# Patient Record
Sex: Male | Born: 1985 | Race: White | Hispanic: No | Marital: Single | Smoking: Current every day smoker
Health system: Southern US, Community
[De-identification: ages and names within clinical notes are randomized; demographics above are authoritative.]

## PROBLEM LIST (undated history)

## (undated) DIAGNOSIS — R569 Unspecified convulsions: Secondary | ICD-10-CM

---
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
## 2011-11-05 ENCOUNTER — Encounter (HOSPITAL_COMMUNITY): Payer: Self-pay | Admitting: Emergency Medicine

## 2011-11-05 ENCOUNTER — Emergency Department (HOSPITAL_COMMUNITY)
Admission: EM | Admit: 2011-11-05 | Discharge: 2011-11-05 | Discharge status: Against Medical Advice | Payer: Self-pay | Attending: Emergency Medicine | Admitting: Emergency Medicine

## 2011-11-05 DIAGNOSIS — R569 Unspecified convulsions: Secondary | ICD-10-CM | POA: Insufficient documentation

## 2011-11-05 DIAGNOSIS — R51 Headache: Secondary | ICD-10-CM | POA: Insufficient documentation

## 2011-11-05 DIAGNOSIS — F17200 Nicotine dependence, unspecified, uncomplicated: Secondary | ICD-10-CM | POA: Insufficient documentation

## 2011-11-05 NOTE — ED Notes (Signed)
Patient has refused all test.... Did not obtain urine because of this reason. Patient states he is ready to leave AMA
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
## 2011-11-05 NOTE — ED Notes (Signed)
Pt here a/o x 3 with c/o slight headache. Pt denies medical problems in the past.
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
## 2016-06-02 ENCOUNTER — Emergency Department (HOSPITAL_COMMUNITY)
Admission: EM | Admit: 2016-06-02 | Discharge: 2016-06-03 | Disposition: A | Discharge status: Home / Self Care | Payer: Self-pay | Attending: Emergency Medicine | Admitting: Emergency Medicine

## 2016-06-02 ENCOUNTER — Emergency Department (HOSPITAL_COMMUNITY): Payer: Self-pay

## 2016-06-02 ENCOUNTER — Encounter (HOSPITAL_COMMUNITY): Payer: Self-pay

## 2016-06-02 DIAGNOSIS — F17200 Nicotine dependence, unspecified, uncomplicated: Secondary | ICD-10-CM | POA: Insufficient documentation

## 2016-06-02 DIAGNOSIS — M79671 Pain in right foot: Secondary | ICD-10-CM | POA: Insufficient documentation

## 2016-06-02 MED ORDER — DICLOFENAC SODIUM 50 MG PO TBEC: 50.0000 mg | DELAYED_RELEASE_TABLET | Freq: Two times a day (BID) | ORAL | 0 refills | Status: DC

## 2016-06-02 NOTE — ED Provider Notes (Signed)
[facility]-EMERGENCY DEPT Provider Note   CSN: [id number] Arrival date & time: 06/02/16  2304  By signing my name below, I, Modena Jansky, attest that this documentation has been prepared under the direction and in the presence of non-physician practitioner, Kerrie Buffalo, NP. Electronically Signed: Modena Jansky, Scribe. 06/02/2016. 11:31 PM.  History   Chief Complaint Chief Complaint  Patient presents with  . Foot Pain   The history is provided by the patient. No language interpreter was used.  Foot Pain  This is a new problem. The current episode started more than 1 week ago. The problem occurs constantly. The problem has been gradually worsening. The symptoms are aggravated by walking. The symptoms are relieved by medications. He has tried acetaminophen for the symptoms. The treatment provided mild relief.   HPI Comments: Jeffrey Rios is a 30 y.o. male who presents to the Emergency Department complaining of right foot pain that started a month ago. He states the pain started on the bottom and gradually spread to the rest of the foot Swelling started 2 days ago. Pain is exacerbated by ambulation and relieved by OTC pain medication. He works at [employer], but recalls no specific injury. Denies any other complaints.   History reviewed. No pertinent past medical history.  There are no active problems to display for this patient.   History reviewed. No pertinent surgical history.     Home Medications    Prior to Admission medications   Medication Sig Start Date End Date Taking? Authorizing Provider  diclofenac (VOLTAREN) 50 MG EC tablet Take 1 tablet (50 mg total) by mouth 2 (two) times daily. 06/02/16   Haila Dena Orlene Och, NP    Family History History reviewed. No pertinent family history.  Social History Social History  Substance Use Topics  . Smoking status: Current Every Day Smoker  . Smokeless tobacco: Never Used  . Alcohol use Yes     Comment: daily     Allergies     Penicillins   Review of Systems Review of Systems  Constitutional: Negative for fever.  Musculoskeletal: Positive for arthralgias and joint swelling.       Right foot pain   Physical Exam Updated Vital Signs BP 134/80 (BP Location: Right Arm)   Pulse 80   Temp 98.2 F (36.8 C) (Oral)   Resp 16   SpO2 100%   Physical Exam  Constitutional: He appears well-developed and well-nourished. No distress.  HENT:  Head: Normocephalic and atraumatic.  Eyes: Conjunctivae are normal.  Neck: Neck supple.  Cardiovascular: Normal rate.   Pulmonary/Chest: Effort normal.  Abdominal: Soft.  Musculoskeletal:       Right foot: There is tenderness. There is normal range of motion, normal capillary refill, no crepitus, no deformity and no laceration. Swelling: minimal.  Pedal pulses are +2. DP pulses are +2. Pain radiates to the lateral aspect of the right ankle. No calf tenderness.   Neurological: He is alert.  Skin: Skin is warm and dry.  Psychiatric: He has a normal mood and affect. His behavior is normal.  Nursing note and vitals reviewed.    ED Treatments / Results  DIAGNOSTIC STUDIES: Oxygen Saturation is 100% on RA, normal by my interpretation.    COORDINATION OF CARE: 11:35 PM- Pt advised of plan for treatment and pt agrees.  Labs (all labs ordered are listed, but only abnormal results are displayed) Labs Reviewed - No data to display  Radiology Dg Foot Complete Right  Result Date: 06/02/2016 CLINICAL DATA:  30 y/o M; pain in the ball of foot none goes around the front of the foot in across the metatarsals starting 1 month ago. No known injury. EXAM: RIGHT FOOT COMPLETE - 3+ VIEW COMPARISON:  None. FINDINGS: There is no evidence of fracture or dislocation. There is no evidence of arthropathy or other focal bone abnormality. Soft tissues are unremarkable. IMPRESSION: Negative. Electronically Signed   By: Mitzi HansenLance  Furusawa-Stratton M.D.   On: 06/02/2016 23:43     Procedures Procedures (including critical care time)  Medications Ordered in ED Medications - No data to display   Initial Impression / Assessment and Plan / ED Course  I have reviewed the triage vital signs and the nursing notes.  Pertinent imaging results that were available during my care of the patient were reviewed by me and considered in my medical decision making (see chart for details).  Clinical Course    Patient X-Ray negative for obvious fracture or dislocation. Pain managed in ED. Pt advised to follow up with orthopedics if symptoms persist for possibility of missed fracture diagnosis. Patient given brace while in ED, conservative therapy recommended and discussed. Patient will be dc home & is agreeable with above plan.  Final Clinical Impressions(s) / ED Diagnoses   Final diagnoses:  Foot pain, right    New Prescriptions New Prescriptions   DICLOFENAC (VOLTAREN) 50 MG EC TABLET    Take 1 tablet (50 mg total) by mouth 2 (two) times daily.   I personally performed the services described in this documentation, which was scribed in my presence. The recorded information has been reviewed and is accurate.     [address]Marsena Taff MarieM Palyn Scrima, NP 06/03/16 0004    Dione Boozeavid Glick, MD 06/03/16 [phone]

## 2016-06-02 NOTE — Discharge Instructions (Signed)
If the pain continues call Dr. Diamantina Providenceean's office for follow up. Try and elevate the area, apply ice and wear the ankle brace for comfort.

## 2016-06-02 NOTE — ED Triage Notes (Signed)
Pt complaining of foot pain. Pt states swelling and pain to right foot. Pt denies any injury/trauma.

## 2020-02-18 ENCOUNTER — Emergency Department (HOSPITAL_COMMUNITY)
Admission: EM | Admit: 2020-02-18 | Discharge: 2020-02-18 | Disposition: A | Discharge status: Home / Self Care | Payer: Self-pay | Attending: Emergency Medicine | Admitting: Emergency Medicine

## 2020-02-18 ENCOUNTER — Emergency Department (HOSPITAL_COMMUNITY): Payer: Self-pay

## 2020-02-18 ENCOUNTER — Other Ambulatory Visit: Payer: Self-pay

## 2020-02-18 DIAGNOSIS — Z79899 Other long term (current) drug therapy: Secondary | ICD-10-CM | POA: Insufficient documentation

## 2020-02-18 DIAGNOSIS — S0990XA Unspecified injury of head, initial encounter: Secondary | ICD-10-CM | POA: Insufficient documentation

## 2020-02-18 DIAGNOSIS — Y999 Unspecified external cause status: Secondary | ICD-10-CM | POA: Insufficient documentation

## 2020-02-18 DIAGNOSIS — Y939 Activity, unspecified: Secondary | ICD-10-CM | POA: Insufficient documentation

## 2020-02-18 DIAGNOSIS — R4182 Altered mental status, unspecified: Secondary | ICD-10-CM | POA: Insufficient documentation

## 2020-02-18 DIAGNOSIS — X58XXXA Exposure to other specified factors, initial encounter: Secondary | ICD-10-CM | POA: Insufficient documentation

## 2020-02-18 DIAGNOSIS — F17210 Nicotine dependence, cigarettes, uncomplicated: Secondary | ICD-10-CM | POA: Insufficient documentation

## 2020-02-18 DIAGNOSIS — Y9259 Other trade areas as the place of occurrence of the external cause: Secondary | ICD-10-CM | POA: Insufficient documentation

## 2020-02-18 LAB — CBC WITH DIFFERENTIAL/PLATELET
Abs Immature Granulocytes: 0.05 10*3/uL (ref 0.00–0.07)
Basophils Absolute: 0.1 10*3/uL (ref 0.0–0.1)
Basophils Relative: 0 %
Eosinophils Absolute: 0.1 10*3/uL (ref 0.0–0.5)
Eosinophils Relative: 0 %
HCT: 41.2 % (ref 39.0–52.0)
Hemoglobin: 13.8 g/dL (ref 13.0–17.0)
Immature Granulocytes: 0 %
Lymphocytes Relative: 16 %
Lymphs Abs: 2.2 10*3/uL (ref 0.7–4.0)
MCH: 30.9 pg (ref 26.0–34.0)
MCHC: 33.5 g/dL (ref 30.0–36.0)
MCV: 92.4 fL (ref 80.0–100.0)
Monocytes Absolute: 0.7 10*3/uL (ref 0.1–1.0)
Monocytes Relative: 5 %
Neutro Abs: 11 10*3/uL — ABNORMAL HIGH (ref 1.7–7.7)
Neutrophils Relative %: 79 %
Platelets: 337 10*3/uL (ref 150–400)
RBC: 4.46 MIL/uL (ref 4.22–5.81)
RDW: 12.7 % (ref 11.5–15.5)
WBC: 14 10*3/uL — ABNORMAL HIGH (ref 4.0–10.5)
nRBC: 0 % (ref 0.0–0.2)

## 2020-02-18 LAB — COMPREHENSIVE METABOLIC PANEL
ALT: 25 U/L (ref 0–44)
AST: 48 U/L — ABNORMAL HIGH (ref 15–41)
Albumin: 5 g/dL (ref 3.5–5.0)
Alkaline Phosphatase: 88 U/L (ref 38–126)
Anion gap: 11 (ref 5–15)
BUN: 20 mg/dL (ref 6–20)
CO2: 26 mmol/L (ref 22–32)
Calcium: 9.5 mg/dL (ref 8.9–10.3)
Chloride: 106 mmol/L (ref 98–111)
Creatinine, Ser: 1.41 mg/dL — ABNORMAL HIGH (ref 0.61–1.24)
GFR calc Af Amer: >60 mL/min (ref >60)
GFR calc non Af Amer: >60 mL/min (ref >60)
Glucose, Bld: 92 mg/dL (ref 70–99)
Potassium: 3.8 mmol/L (ref 3.5–5.1)
Sodium: 143 mmol/L (ref 135–145)
Total Bilirubin: 1 mg/dL (ref 0.3–1.2)
Total Protein: 8.1 g/dL (ref 6.5–8.1)

## 2020-02-18 LAB — RAPID URINE DRUG SCREEN, HOSP PERFORMED
Amphetamines: POSITIVE — AB
Barbiturates: NOT DETECTED
Benzodiazepines: POSITIVE — AB
Cocaine: POSITIVE — AB
Opiates: NOT DETECTED
Tetrahydrocannabinol: POSITIVE — AB

## 2020-02-18 LAB — ETHANOL: Alcohol, Ethyl (B): <10 mg/dL (ref <10)

## 2020-02-18 LAB — ACETAMINOPHEN LEVEL: Acetaminophen (Tylenol), Serum: <10 ug/mL — ABNORMAL LOW (ref 10–30)

## 2020-02-18 LAB — SALICYLATE LEVEL: Salicylate Lvl: <7 mg/dL — ABNORMAL LOW (ref 7.0–30.0)

## 2020-02-18 MED ORDER — SODIUM CHLORIDE 0.9 % IV BOLUS
500.0000 mL | Freq: Once | INTRAVENOUS | Status: AC
  Administered 2020-02-18: 500 mL via INTRAVENOUS

## 2020-02-18 NOTE — ED Notes (Signed)
Pt in CT.

## 2020-02-18 NOTE — ED Notes (Signed)
Pt. Was given a cup of ice water and a sandwich.
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
## 2020-02-18 NOTE — Progress Notes (Signed)
CSW created list of resources for Pt  [facility] [address] [place], [place], [place] [address] [phone]  Call to make an appointment. No insurance needed. Ask about [employer] eligibility!   [facility]  [address], [place], [place] [address] [phone]  Low cost/no cost therapeutic services available.  [facility] Services Hotline [phone]
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
## 2020-02-18 NOTE — Discharge Instructions (Addendum)
At this time there does not appear to be the presence of an emergent medical condition, however there is always the potential for conditions to change. Please read and follow the below instructions.  Please return to the Emergency Department immediately for any new or worsening symptoms. Please be sure to follow up with your Primary Care Provider within one week regarding your visit today; please call their office to schedule an appointment even if you are feeling better for a follow-up visit.  Get help right away if: You have: A very bad headache that is not helped by medicine. Trouble walking or weakness in your arms and legs. Clear or bloody fluid coming from your nose or ears. Changes in how you see (vision). Shaking movements that you cannot control. You lose your balance. You vomit. The black centers of your eyes (pupils) change in size. Your speech is slurred. Your dizziness gets worse. You pass out. You are sleepier than normal and have trouble staying awake. Your symptoms get worse. These symptoms may be an emergency. Do not wait to see if the symptoms will go away. Get medical help right away. Call your local emergency services (911 in the [place] Do not drive yourself to the hospital.  Please read the additional information packets attached to your discharge summary.  Do not take your medicine if  develop an itchy rash, swelling in your mouth or lips, or difficulty breathing; call 911 and seek immediate emergency medical attention if this occurs.  Note: Portions of this text may have been transcribed using voice recognition software. Every effort was made to ensure accuracy; however, inadvertent computerized transcription errors may still be present.
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
## 2020-02-22 ENCOUNTER — Emergency Department (HOSPITAL_COMMUNITY)
Admission: EM | Admit: 2020-02-22 | Discharge: 2020-02-22 | Discharge status: Against Medical Advice | Payer: Self-pay | Attending: Emergency Medicine | Admitting: Emergency Medicine

## 2020-02-22 DIAGNOSIS — F17200 Nicotine dependence, unspecified, uncomplicated: Secondary | ICD-10-CM | POA: Insufficient documentation

## 2020-02-22 DIAGNOSIS — T50901A Poisoning by unspecified drugs, medicaments and biological substances, accidental (unintentional), initial encounter: Secondary | ICD-10-CM | POA: Insufficient documentation

## 2020-02-22 NOTE — Discharge Instructions (Signed)
Return to the emergency room with any new, worsening, concerning symptoms.
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
## 2023-01-09 ENCOUNTER — Observation Stay (HOSPITAL_COMMUNITY): Payer: Self-pay

## 2023-01-09 ENCOUNTER — Encounter (HOSPITAL_COMMUNITY): Payer: Self-pay

## 2023-01-09 ENCOUNTER — Emergency Department (HOSPITAL_COMMUNITY): Payer: Self-pay

## 2023-01-09 ENCOUNTER — Other Ambulatory Visit: Payer: Self-pay

## 2023-01-09 ENCOUNTER — Observation Stay (HOSPITAL_COMMUNITY)
Admission: EM | Admit: 2023-01-09 | Discharge: 2023-01-11 | Disposition: A | Discharge status: Home / Self Care | Payer: Self-pay | Attending: Internal Medicine | Admitting: Internal Medicine

## 2023-01-09 DIAGNOSIS — S01112A Laceration without foreign body of left eyelid and periocular area, initial encounter: Secondary | ICD-10-CM | POA: Insufficient documentation

## 2023-01-09 DIAGNOSIS — S0121XA Laceration without foreign body of nose, initial encounter: Secondary | ICD-10-CM | POA: Insufficient documentation

## 2023-01-09 DIAGNOSIS — R748 Abnormal levels of other serum enzymes: Secondary | ICD-10-CM | POA: Insufficient documentation

## 2023-01-09 DIAGNOSIS — H1132 Conjunctival hemorrhage, left eye: Secondary | ICD-10-CM | POA: Insufficient documentation

## 2023-01-09 DIAGNOSIS — G40909 Epilepsy, unspecified, not intractable, without status epilepticus: Principal | ICD-10-CM | POA: Insufficient documentation

## 2023-01-09 DIAGNOSIS — W01198A Fall on same level from slipping, tripping and stumbling with subsequent striking against other object, initial encounter: Secondary | ICD-10-CM | POA: Insufficient documentation

## 2023-01-09 DIAGNOSIS — Z79899 Other long term (current) drug therapy: Secondary | ICD-10-CM | POA: Insufficient documentation

## 2023-01-09 DIAGNOSIS — N179 Acute kidney failure, unspecified: Secondary | ICD-10-CM | POA: Insufficient documentation

## 2023-01-09 DIAGNOSIS — R569 Unspecified convulsions: Secondary | ICD-10-CM

## 2023-01-09 DIAGNOSIS — F17200 Nicotine dependence, unspecified, uncomplicated: Secondary | ICD-10-CM | POA: Insufficient documentation

## 2023-01-09 DIAGNOSIS — S0502XA Injury of conjunctiva and corneal abrasion without foreign body, left eye, initial encounter: Secondary | ICD-10-CM | POA: Insufficient documentation

## 2023-01-09 DIAGNOSIS — S0181XA Laceration without foreign body of other part of head, initial encounter: Secondary | ICD-10-CM

## 2023-01-09 DIAGNOSIS — E871 Hypo-osmolality and hyponatremia: Secondary | ICD-10-CM | POA: Insufficient documentation

## 2023-01-09 HISTORY — DX: Unspecified convulsions: R56.9

## 2023-01-09 LAB — CBC WITH DIFFERENTIAL/PLATELET
Abs Immature Granulocytes: 0.06 10*3/uL (ref 0.00–0.07)
Basophils Absolute: 0 10*3/uL (ref 0.0–0.1)
Basophils Relative: 0 %
Eosinophils Absolute: 0.1 10*3/uL (ref 0.0–0.5)
Eosinophils Relative: 1 %
HCT: 45.9 % (ref 39.0–52.0)
Hemoglobin: 15.7 g/dL (ref 13.0–17.0)
Immature Granulocytes: 0 %
Lymphocytes Relative: 6 %
Lymphs Abs: 0.8 10*3/uL (ref 0.7–4.0)
MCH: 31.1 pg (ref 26.0–34.0)
MCHC: 34.2 g/dL (ref 30.0–36.0)
MCV: 90.9 fL (ref 80.0–100.0)
Monocytes Absolute: 0.7 10*3/uL (ref 0.1–1.0)
Monocytes Relative: 5 %
Neutro Abs: 12.5 10*3/uL — ABNORMAL HIGH (ref 1.7–7.7)
Neutrophils Relative %: 88 %
Platelets: 278 10*3/uL (ref 150–400)
RBC: 5.05 MIL/uL (ref 4.22–5.81)
RDW: 12.3 % (ref 11.5–15.5)
WBC: 14.1 10*3/uL — ABNORMAL HIGH (ref 4.0–10.5)
nRBC: 0 % (ref 0.0–0.2)

## 2023-01-09 LAB — COMPREHENSIVE METABOLIC PANEL
ALT: 17 U/L (ref 0–44)
AST: 35 U/L (ref 15–41)
Albumin: 4.1 g/dL (ref 3.5–5.0)
Alkaline Phosphatase: 79 U/L (ref 38–126)
Anion gap: 13 (ref 5–15)
BUN: 9 mg/dL (ref 6–20)
CO2: 26 mmol/L (ref 22–32)
Calcium: 9.5 mg/dL (ref 8.9–10.3)
Chloride: 94 mmol/L — ABNORMAL LOW (ref 98–111)
Creatinine, Ser: 1.41 mg/dL — ABNORMAL HIGH (ref 0.61–1.24)
GFR, Estimated: >60 mL/min (ref >60)
Glucose, Bld: 83 mg/dL (ref 70–99)
Potassium: 4.1 mmol/L (ref 3.5–5.1)
Sodium: 133 mmol/L — ABNORMAL LOW (ref 135–145)
Total Bilirubin: 0.6 mg/dL (ref 0.3–1.2)
Total Protein: 7.3 g/dL (ref 6.5–8.1)

## 2023-01-09 LAB — ETHANOL: Alcohol, Ethyl (B): <10 mg/dL (ref <10)

## 2023-01-09 LAB — CBG MONITORING, ED: Glucose-Capillary: 86 mg/dL (ref 70–99)

## 2023-01-09 MED ORDER — LORAZEPAM 2 MG/ML IJ SOLN: 2.0000 mg | Freq: Once | INTRAMUSCULAR | Status: DC | PRN

## 2023-01-09 MED ORDER — HYDROCODONE-ACETAMINOPHEN 5-325 MG PO TABS
1.0000 | ORAL_TABLET | Freq: Once | ORAL | Status: AC
  Administered 2023-01-09: 1 via ORAL
  Filled 2023-01-09: qty 1

## 2023-01-09 MED ORDER — SODIUM CHLORIDE 0.9 % IV BOLUS: 1000.0000 mL | Freq: Once | INTRAVENOUS | Status: DC

## 2023-01-09 MED ORDER — LIDOCAINE HCL (PF) 1 % IJ SOLN
10.0000 mL | Freq: Once | INTRAMUSCULAR | Status: AC
  Administered 2023-01-09: 10 mL
  Filled 2023-01-09: qty 10

## 2023-01-09 MED ORDER — TETRACAINE HCL 0.5 % OP SOLN
2.0000 [drp] | Freq: Once | OPHTHALMIC | Status: AC
  Administered 2023-01-09: 2 [drp] via OPHTHALMIC
  Filled 2023-01-09: qty 4

## 2023-01-09 MED ORDER — LIDOCAINE HCL (PF) 1 % IJ SOLN
INTRAMUSCULAR | Status: AC
  Filled 2023-01-09: qty 5

## 2023-01-09 MED ORDER — LORAZEPAM 2 MG/ML IJ SOLN
0.5000 mg | Freq: Once | INTRAMUSCULAR | Status: AC
  Administered 2023-01-09: 0.5 mg via INTRAVENOUS
  Filled 2023-01-09: qty 1

## 2023-01-09 MED ORDER — TETANUS-DIPHTH-ACELL PERTUSSIS 5-2.5-18.5 LF-MCG/0.5 IM SUSY
0.5000 mL | PREFILLED_SYRINGE | Freq: Once | INTRAMUSCULAR | Status: DC
  Filled 2023-01-09: qty 0.5

## 2023-01-09 MED ORDER — NICOTINE 14 MG/24HR TD PT24
14.0000 mg | MEDICATED_PATCH | Freq: Every day | TRANSDERMAL | Status: DC
  Administered 2023-01-10 – 2023-01-11 (×2): 14 mg via TRANSDERMAL
  Filled 2023-01-09 (×2): qty 1

## 2023-01-09 MED ORDER — FLUORESCEIN SODIUM 1 MG OP STRP
1.0000 | ORAL_STRIP | Freq: Once | OPHTHALMIC | Status: AC
  Administered 2023-01-09: 1 via OPHTHALMIC
  Filled 2023-01-09: qty 1

## 2023-01-09 MED ORDER — SODIUM CHLORIDE 0.9 % IV SOLN
3000.0000 mg | Freq: Once | INTRAVENOUS | Status: AC
  Administered 2023-01-09: 3000 mg via INTRAVENOUS
  Filled 2023-01-09: qty 30

## 2023-01-09 MED ORDER — ERYTHROMYCIN 5 MG/GM OP OINT
1.0000 | TOPICAL_OINTMENT | Freq: Four times a day (QID) | OPHTHALMIC | Status: DC
  Administered 2023-01-10 – 2023-01-11 (×7): 1 via OPHTHALMIC
  Filled 2023-01-09: qty 3.5

## 2023-01-09 MED ORDER — SODIUM CHLORIDE 0.9 % IV SOLN: INTRAVENOUS | Status: DC

## 2023-01-09 NOTE — Consult Note (Signed)
NEURO HOSPITALIST CONSULT NOTE   Requesting physician: Dr. Joneen Roach  Reason for Consult: Recurrence of seizure activity.   History obtained from:  Patient, Girlfriend and Chart     HPI:                                                                                                                                          Jeffrey Rios is an 37 y.o. male with a PMHx of seizures, first occurring about 15 years ago, with a total of approximately 3 more since then, who presents today after having another seizure (5th of his lifetime) which was witnessed by his girlfriend at home today. She states that she saw him walking, suddenly twist sideways and fall to the ground followed by stiffening of his BUE in outstretched position, legs extended, making grunting noises, eyes rolled up in head and with shaking of his entire body. The seizure lasted for 3-5 minutes and he was postictal afterwards. There was tongue biting but no bowel/bladder incontinence. Movements were so forceful that he sustained numerous abrasions and cuts to his face and forehead, mostly on the left side. Due to financial reasons, he has been unable to afford a doctors visit and has not been on any anticonvulsants.   Additional history from EDP note has been reviewed: "Patient with history of seizures presents today with complaints of seizure. Patients significant other is present at the bedside and was present for the episode. States that he was going to do laundry and turned around and let out a loud moan, fell forward and hit his head against the corner of a brick wall and started having full body convulsions. She states that this lasted about 3-5 minutes and then resolved. She states he was confused for a brief interval and then slowly returned to baseline. He did bite his tongue but did not urinate on himself. He states that he has had 5-6 seizures previously with the last one being about a year and a half ago and the  first being around 20 years ago when he was in college. He has never been seen or worked up for his seizures previously. He does state that he has been feeling generally unwell for the past few days with nausea and vomiting and usually his seizures are triggered by some sort of stressor such as this. He endorses pain in his face, several lacerations noted to same. Denies chest pain, shortness of breath, abdominal pain."  Past Medical History:  Diagnosis Date   Seizures     No past surgical history on file.  No family history on file.          Social History:  reports that he has been smoking. He has never used smokeless tobacco. He reports current alcohol  use. He reports current drug use.  Allergies  Allergen Reactions   Penicillins Other (See Comments)    Childhood reaction    MEDICATIONS:                                                                                                                      No current facility-administered medications on file prior to encounter.   Current Outpatient Medications on File Prior to Encounter  Medication Sig Dispense Refill   diclofenac (VOLTAREN) 50 MG EC tablet Take 1 tablet (50 mg total) by mouth 2 (two) times daily. (Patient not taking: Reported on 02/18/2020) 15 tablet 0     ROS:                                                                                                                                       In considerable pain from the abrasions sustained during his seizure. Other ROS as per HPI.    Blood pressure (!) 157/112, pulse 87, temperature 98 F (36.7 C), temperature source Oral, resp. rate 12, SpO2 99 %.   General Examination:                                                                                                       Physical Exam  HEENT-  Numerous abrasions noted.  Left eyelid/periorbital region is swollen.  Lungs- Respirations unlabored Extremities- No edema   Neurological Examination Mental  Status: Awake and alert. Fully oriented. Speech fluent with intact naming and comprehension.  Cranial Nerves: II: Visual fields intact bilaterally. No extinction to DSS.   III,IV, VI: No ptosis. EOMI. No nystagmus.  V: Temp sensation equal bilaterally VII: Smile symmetric VIII: Hearing intact to conversation IX,X: No hoarseness or hypophonia XI: Symmetric XII: Midline tongue extension Motor: Right : Upper extremity   5/5    Left:     Upper extremity   5/5  Lower extremity   5/5     Lower extremity  5/5 No pronator drift Sensory: Temp and light touch intact throughout, bilaterally Deep Tendon Reflexes: 2+ and symmetric throughout Cerebellar: No ataxia with FNF bilaterally Gait: Deferred   Lab Results: Basic Metabolic Panel: Recent Labs  Lab 01/09/23 1458  NA 133*  K 4.1  CL 94*  CO2 26  GLUCOSE 83  BUN 9  CREATININE 1.41*  CALCIUM 9.5    CBC: Recent Labs  Lab 01/09/23 1458  WBC 14.1*  NEUTROABS 12.5*  HGB 15.7  HCT 45.9  MCV 90.9  PLT 278    Cardiac Enzymes: No results for input(s): "CKTOTAL", "CKMB", "CKMBINDEX", "TROPONINI" in the last 168 hours.  Lipid Panel: No results for input(s): "CHOL", "TRIG", "HDL", "CHOLHDL", "VLDL", "LDLCALC" in the last 168 hours.  Imaging: No results found.  Assessment: 37 y.o. male with a PMHx of seizures, first occurring about 15 years ago, with a total of approximately 3 more since then, who presents today after having another seizure (5th of his lifetime) which was witnessed by his girlfriend at home today. She states that she saw him walking, suddenly twist sideways and fall to the ground followed by stiffening of his BUE in outstretched position, legs extended, making grunting noises, eyes rolled up in head and with shaking of his entire body. The seizure lasted for 3-5 minutes and he was postictal afterwards. There was tongue biting but no bowel/bladder incontinence. Movements were so forceful that he sustained numerous  abrasions and cuts to his face and forehead, mostly on the left side. Due to financial reasons, he has been unable to afford a doctors visit and has not been on any anticonvulsants.  - Exam reveals face and forehead abrasions. No focal neurological deficit noted.  - CT head:  No acute intracranial abnormality. Soft tissue injury with subcutaneous gas along the infraorbital soft tissues on the left. No evidence of an underlying facial bone fracture. - WBC elevated, most likely reactive secondary to seizure - Na 133, Ca 9.5, BUN 9, Cr 1.41. AST and ALT normal. Glucose 83. EtOH < 10 - Etiology for his seizure is unknown, although illicit substance use may have played a role given UDS positive for multiple substances during prior evaluation in 2021  Recommendations: - UDS - MRI brain - EEG - Inpatient seizure precautions.  - Outpatient seizure precautions: Per [facility] statutes, patients with seizures are not allowed to drive until  they have been seizure-free for six months. Use caution when using heavy equipment or power tools. Avoid working on ladders or at heights. Take showers instead of baths. Ensure the water temperature is not too high on the home water heater. Do not go swimming alone. When caring for infants or small children, sit down when holding, feeding, or changing them to minimize risk of injury to the child in the event you have a seizure. Also, Maintain good sleep hygiene. Avoid alcohol. - He has been loaded with Keppra 3000 mg IV. Continue scheduled dosing at 500 mg po BID - Will need Case Management consult for the following: [employer] information for him and his girlfriend as they are caring for 3 children and may be eligible; may need drug cessation counseling if UDS comes back positive; needs a referral to low-cost/charity medical clinic for refills of Keppra; needs information on low-cost pharmacy options.   Electronically signed: Dr. Caryl Pina 01/09/2023,  5:13 PM

## 2023-01-09 NOTE — Progress Notes (Signed)
Per nurse the patient isn't available at this moment , EEG tech will check back in 40 minutes.

## 2023-01-09 NOTE — Progress Notes (Cosign Needed Addendum)
EEG complete - results pending 

## 2023-01-09 NOTE — ED Provider Notes (Signed)
[facility] AT [facility] Provider Note   CSN: [id number] Arrival date & time: 01/09/23  1300     History  Chief Complaint  Patient presents with   Seizures    Facial lacerations    Jeffrey Rios is a 37 y.o. male.  Patient with history of seizures presents today with complaints of seizure. Patients significant other is present at the bedside and was present for the episode. States that he was going to do laundry and turned around and let out a loud moan, fell forward and hit his head against the corner of a brick wall and started having full body convulsions. She states that this lasted about 3-5 minutes and then resolved. She states he was confused for a brief interval and then slowly returned to baseline. He did bite his tongue but did not urinate on himself. He states that he has had 5-6 seizures previously with the last one being about a year and a half ago and the first being around 20 years ago when he was in college. He has never been seen or worked up for his seizures previously. He does state that he has been feeling generally unwell for the past few days with nausea and vomiting and usually his seizures are triggered by some sort of stressor such as this. He endorses pain in his face, several lacerations noted to same. Denies chest pain, shortness of breath, abdominal pain.  The history is provided by the patient. No language interpreter was used.  Seizures      Home Medications Prior to Admission medications   Medication Sig Start Date End Date Taking? Authorizing Provider  diclofenac (VOLTAREN) 50 MG EC tablet Take 1 tablet (50 mg total) by mouth 2 (two) times daily. Patient not taking: Reported on 02/18/2020 06/02/16   Janne Napoleon, NP      Allergies    Penicillins    Review of Systems   Review of Systems  Neurological:  Positive for seizures.  All other systems reviewed and are negative.   Physical Exam Updated Vital  Signs There were no vitals taken for this visit. Physical Exam Vitals and nursing note reviewed.  Constitutional:      General: He is not in acute distress.    Appearance: Normal appearance. He is normal weight. He is not ill-appearing, toxic-appearing or diaphoretic.  HENT:     Head: Normocephalic and atraumatic.  Eyes:     General: Lids are normal. Lids are everted, no foreign bodies appreciated. Vision grossly intact.     Comments: Left eye with small corneal abrasion over the pupil. No signs of foreign body. EOMs intact without pain. No ulceration or hyphema.   Cardiovascular:     Rate and Rhythm: Normal rate.  Pulmonary:     Effort: Pulmonary effort is normal. No respiratory distress.  Abdominal:     General: Abdomen is flat.     Palpations: Abdomen is soft.     Tenderness: There is no abdominal tenderness.  Musculoskeletal:        General: Normal range of motion.     Cervical back: Normal range of motion.  Skin:    General: Skin is warm and dry.     Comments: 2 cm horizontal laceration noted to the left upper eyelid. 5 cm horizontal laceration noted lateral to the lateral canthus. It does not traverse the eyelid. See images below for further.  2 cm laceration noted to the bridge of the nose  Neurological:     General: No focal deficit present.     Mental Status: He is alert and oriented to person, place, and time.     GCS: GCS eye subscore is 4. GCS verbal subscore is 5. GCS motor subscore is 6.     Sensory: Sensation is intact.     Motor: Motor function is intact.     Coordination: Coordination is intact.     Gait: Gait is intact.     Comments: Alert and oriented to self, place, time and event.    Speech is fluent, clear without dysarthria or dysphasia.    Strength 5/5 in upper/lower extremities   Sensation intact in upper/lower extremities    CN I not tested  CN II grossly intact visual fields bilaterally. Did not visualize posterior eye.  CN III, IV, VI PERRLA  and EOMs intact bilaterally  CN V Intact sensation to sharp and light touch to the face  CN VII facial movements symmetric  CN VIII not tested  CN IX, X no uvula deviation, symmetric rise of soft palate  CN XI 5/5 SCM and trapezius strength bilaterally  CN XII Midline tongue protrusion, symmetric L/R movements   Psychiatric:        Mood and Affect: Mood normal.        Behavior: Behavior normal.     ED Results / Procedures / Treatments   Labs (all labs ordered are listed, but only abnormal results are displayed) Labs Reviewed  CBC WITH DIFFERENTIAL/PLATELET - Abnormal; Notable for the following components:      Result Value   WBC 14.1 (*)    Neutro Abs 12.5 (*)    All other components within normal limits  COMPREHENSIVE METABOLIC PANEL - Abnormal; Notable for the following components:   Sodium 133 (*)    Chloride 94 (*)    Creatinine, Ser 1.41 (*)    All other components within normal limits  ETHANOL  URINALYSIS, ROUTINE W REFLEX MICROSCOPIC  RAPID URINE DRUG SCREEN, HOSP PERFORMED  CBG MONITORING, ED    EKG EKG Interpretation  Date/Time:  Saturday January 09 2023 13:10:49 EDT Ventricular Rate:  111 PR Interval:  134 QRS Duration: 100 QT Interval:  350 QTC Calculation: 476 R Axis:   55 Text Interpretation: Sinus tachycardia Borderline prolonged QT interval Artifact in lead(s) I II aVR aVL aVF and baseline wander in lead(s) I II III aVR aVL aVF V1 V3 V4 V5 Interpretation limited secondary to artifact Confirmed by Vonita Moss [phone]) on 01/09/2023 1:26:36 PM  Radiology CT Head Wo Contrast  Result Date: 01/09/2023 CLINICAL DATA:  Trauma EXAM: CT HEAD WITHOUT CONTRAST CT MAXILLOFACIAL WITHOUT CONTRAST TECHNIQUE: Multidetector CT imaging of the head and maxillofacial structures were performed using the standard protocol without intravenous contrast. Multiplanar CT image reconstructions of the maxillofacial structures were also generated. RADIATION DOSE REDUCTION: This exam  was performed according to the departmental dose-optimization program which includes automated exposure control, adjustment of the mA and/or kV according to patient size and/or use of iterative reconstruction technique. COMPARISON:  None Available. FINDINGS: CT HEAD FINDINGS Brain: No evidence of acute infarction, hemorrhage, hydrocephalus, extra-axial collection or mass lesion/mass effect. Vascular: No hyperdense vessel or unexpected calcification. Skull: Normal. Negative for fracture or focal lesion. Other: None. CT MAXILLOFACIAL FINDINGS Osseous: No fracture or mandibular dislocation. No destructive process. Orbits: Negative. No traumatic or inflammatory finding.1 Sinuses: No middle ear or mastoid effusion. Mild pansinus mucosal thickening. Orbits are unremarkable. Soft tissues: Soft tissue injury with  subcutaneous gas along the infraorbital soft tissues on the left. No evidence of an underlying facial bone fracture. IMPRESSION: 1. No acute intracranial abnormality. 2. Soft tissue injury with subcutaneous gas along the infraorbital soft tissues on the left. No evidence of an underlying facial bone fracture. Electronically Signed   By: Lorenza Cambridge M.D.   On: 01/09/2023 14:46   CT Maxillofacial Wo Contrast  Result Date: 01/09/2023 CLINICAL DATA:  Trauma EXAM: CT HEAD WITHOUT CONTRAST CT MAXILLOFACIAL WITHOUT CONTRAST TECHNIQUE: Multidetector CT imaging of the head and maxillofacial structures were performed using the standard protocol without intravenous contrast. Multiplanar CT image reconstructions of the maxillofacial structures were also generated. RADIATION DOSE REDUCTION: This exam was performed according to the departmental dose-optimization program which includes automated exposure control, adjustment of the mA and/or kV according to patient size and/or use of iterative reconstruction technique. COMPARISON:  None Available. FINDINGS: CT HEAD FINDINGS Brain: No evidence of acute infarction, hemorrhage,  hydrocephalus, extra-axial collection or mass lesion/mass effect. Vascular: No hyperdense vessel or unexpected calcification. Skull: Normal. Negative for fracture or focal lesion. Other: None. CT MAXILLOFACIAL FINDINGS Osseous: No fracture or mandibular dislocation. No destructive process. Orbits: Negative. No traumatic or inflammatory finding.1 Sinuses: No middle ear or mastoid effusion. Mild pansinus mucosal thickening. Orbits are unremarkable. Soft tissues: Soft tissue injury with subcutaneous gas along the infraorbital soft tissues on the left. No evidence of an underlying facial bone fracture. IMPRESSION: 1. No acute intracranial abnormality. 2. Soft tissue injury with subcutaneous gas along the infraorbital soft tissues on the left. No evidence of an underlying facial bone fracture. Electronically Signed   By: Lorenza Cambridge M.D.   On: 01/09/2023 14:46    Procedures .Marland KitchenLaceration Repair  Date/Time: 01/09/2023 8:59 PM  Performed by: Silva Bandy, PA-C Authorized by: Silva Bandy, PA-C   Consent:    Consent obtained:  Verbal   Consent given by:  Patient   Risks, benefits, and alternatives were discussed: yes     Risks discussed:  Infection, need for additional repair, nerve damage, poor wound healing, poor cosmetic result, pain, retained foreign body, tendon damage and vascular damage   Alternatives discussed:  No treatment, delayed treatment, observation and referral Universal protocol:    Procedure explained and questions answered to patient or proxy's satisfaction: yes     Imaging studies available: yes     Patient identity confirmed:  Verbally with patient Anesthesia:    Anesthesia method:  Local infiltration   Local anesthetic:  Lidocaine 1% w/o epi Laceration details:    Location:  Face   Face location:  L upper eyelid   Extent:  Partial thickness   Length (cm):  2   Depth (mm):  2 Exploration:    Hemostasis achieved with:  Direct pressure Treatment:    Area cleansed with:   Soap and water   Amount of cleaning:  Standard   Irrigation solution:  Sterile saline   Irrigation volume:  500 ml   Irrigation method:  Pressure wash Skin repair:    Repair method:  Sutures   Suture size:  5-0   Wound skin closure material used: ethilon.   Number of sutures:  3 Approximation:    Approximation:  Close Repair type:    Repair type:  Simple Post-procedure details:    Dressing:  Open (no dressing)   Procedure completion:  Tolerated well, no immediate complications .Marland KitchenLaceration Repair  Date/Time: 01/09/2023 9:01 PM  Performed by: Silva Bandy, PA-C Authorized by: Lurena Nida  A, PA-C   Consent:    Consent obtained:  Verbal   Consent given by:  Patient   Risks, benefits, and alternatives were discussed: yes     Risks discussed:  Infection, need for additional repair, nerve damage, poor wound healing, poor cosmetic result, pain, retained foreign body, tendon damage and vascular damage   Alternatives discussed:  No treatment, delayed treatment, observation and referral Universal protocol:    Procedure explained and questions answered to patient or proxy's satisfaction: yes     Imaging studies available: yes     Patient identity confirmed:  Verbally with patient Anesthesia:    Anesthesia method:  Local infiltration   Local anesthetic:  Lidocaine 1% w/o epi Laceration details:    Location:  Face   Facial location: left lateral eye area and into the lower eyelid.   Length (cm):  8   Depth (mm):  2 Exploration:    Imaging outcome: foreign body not noted     Wound exploration: wound explored through full range of motion and entire depth of wound visualized   Treatment:    Area cleansed with:  Soap and water   Amount of cleaning:  Standard   Irrigation solution:  Sterile saline   Irrigation volume:  500 ml   Irrigation method:  Pressure wash Skin repair:    Repair method:  Sutures   Suture size:  6-0   Wound skin closure material used: ethilon.   Number of  sutures:  6 Approximation:    Approximation:  Loose Repair type:    Repair type:  Intermediate Post-procedure details:    Dressing:  Open (no dressing)   Procedure completion:  Tolerated well, no immediate complications .Marland KitchenLaceration Repair  Date/Time: 01/09/2023 9:03 PM  Performed by: Silva Bandy, PA-C Authorized by: Silva Bandy, PA-C   Consent:    Consent obtained:  Verbal   Consent given by:  Patient   Risks, benefits, and alternatives were discussed: yes     Risks discussed:  Infection, need for additional repair, nerve damage, poor wound healing, poor cosmetic result, pain, retained foreign body, tendon damage and vascular damage   Alternatives discussed:  No treatment, delayed treatment, observation and referral Universal protocol:    Procedure explained and questions answered to patient or proxy's satisfaction: yes     Imaging studies available: yes     Patient identity confirmed:  Verbally with patient Anesthesia:    Anesthesia method:  None Laceration details:    Location: nose.   Length (cm):  3   Depth (mm):  1 Exploration:    Hemostasis achieved with:  Direct pressure Treatment:    Area cleansed with:  Soap and water   Amount of cleaning:  Standard   Irrigation solution:  Sterile saline   Irrigation volume:  500 ml   Irrigation method:  Pressure wash Skin repair:    Repair method:  Tissue adhesive Approximation:    Approximation:  Loose Repair type:    Repair type:  Simple Post-procedure details:    Dressing:  Open (no dressing)   Procedure completion:  Tolerated well, no immediate complications     Medications Ordered in ED Medications  Tdap (BOOSTRIX) injection 0.5 mL (0.5 mLs Intramuscular Not Given 01/09/23 1820)  lidocaine (PF) (XYLOCAINE) 1 % injection (has no administration in time range)  erythromycin ophthalmic ointment 1 Application (has no administration in time range)  fluorescein ophthalmic strip 1 strip (1 strip Left Eye Given  01/09/23 1819)  tetracaine (PONTOCAINE)  0.5 % ophthalmic solution 2 drop (2 drops Left Eye Given 01/09/23 1820)  HYDROcodone-acetaminophen (NORCO/VICODIN) 5-325 MG per tablet 1 tablet (1 tablet Oral Given 01/09/23 1353)  levETIRAcetam (KEPPRA) 3,000 mg in sodium chloride 0.9 % 250 mL IVPB (0 mg Intravenous Stopped 01/09/23 1845)  lidocaine (PF) (XYLOCAINE) 1 % injection 10 mL (10 mLs Infiltration Given 01/09/23 1819)  LORazepam (ATIVAN) injection 0.5 mg (0.5 mg Intravenous Given 01/09/23 1817)    ED Course/ Medical Decision Making/ A&P                             Medical Decision Making Amount and/or Complexity of Data Reviewed Labs: ordered. Radiology: ordered.  Risk Prescription drug management. Decision regarding hospitalization.   This patient is a 37 y.o. male who presents to the ED for concern of seizure, this involves an extensive number of treatment options, and is a complaint that carries with it a high risk of complications and morbidity. The emergent differential diagnosis prior to evaluation includes, but is not limited to,  The differential diagnosis for includes but is not limited to idiopathic seizure, traumatic brain injury, intracranial hemorrhage, vascular lesion, mass or space containing lesion, degenerative neurologic disease, congenital brain abnormality, infectious etiology such as meningitis, encephalitis or abscess, metabolic disturbance including hyper or hypoglycemia, hyper or hyponatremia, hyperosmolar state, uremia, hepatic failure, hypocalcemia, hypomagnesemia.  Toxic substances such as cocaine, lidocaine, antidepressants, theophylline, alcohol withdrawal, drug withdrawal, eclampsia, hypertensive encephalopathy and anoxic brain injury.  This is not an exhaustive differential.   Past Medical History / Co-morbidities / Social History: Hx seizures, never worked up for them, no pcp, poor living situation  Physical Exam: Physical exam performed. The pertinent findings  include: Several facial lacerations.  Alert and oriented and neurologically intact without focal deficits.  Lab Tests: I ordered, and personally interpreted labs.  The pertinent results include:  WBC 14.1, likely due to trauma. Kidney function at baseline   Imaging Studies: I ordered imaging studies including CT head, CT max/face. I independently visualized and interpreted imaging which showed   1. No acute intracranial abnormality. 2. Soft tissue injury with subcutaneous gas along the infraorbital soft tissues on the left. No evidence of an underlying facial bone fracture.   I agree with the radiologist interpretation.   Cardiac Monitoring:  The patient was maintained on a cardiac monitor.  My attending physician Dr. Eloise Harman viewed and interpreted the cardiac monitored which showed an underlying rhythm of: no STEMI. I agree with this interpretation.   Medications: I ordered medication including keppra for pain, norco for pain, ativan for MRI, TDAP, erythromycin ointment for corneal abrasio. Reevaluation of the patient after these medicines showed that the patient improved. I have reviewed the patients home medicines and have made adjustments as needed.  Consultations Obtained: I requested consultation with the neurology on call Dr. Otelia Limes,  and discussed lab and imaging findings as well as pertinent plan - they recommend: Keppra load, daily twice daily Keppra.  Admit for EEG, MRI brain with and without contrast.  TOC consult to help with patient's poor living situation and difficulty getting medications.   Disposition: After consideration of the diagnostic results and the patients response to treatment, I feel that patient requires admission for new seizure work-up.  Patient is understanding and in agreement.  Discussed patient with hospitalist who agrees to admit.   I discussed this case with my attending physician Dr. Eloise Harman who cosigned this note including patient's  presenting  symptoms, physical exam, and planned diagnostics and interventions. Attending physician stated agreement with plan or made changes to plan which were implemented.     Final Clinical Impression(s) / ED Diagnoses Final diagnoses:  Seizure  Facial laceration, initial encounter  Abrasion of left cornea, initial encounter    Rx / DC Orders ED Discharge Orders     None         Vear Clock 01/09/23 2108    Rondel Baton, MD 01/10/23 1051

## 2023-01-09 NOTE — H&P (Signed)
PCP:   Patient, No Pcp Per   Chief Complaint:  Seizure  HPI: This is a 37 year old male with past medical history of seizures.  Per girlfriend at bedside, he has had a rather total of 6 seizures over his lifetime.  Per girlfriend he twitches constantly all night during sleep.  Per girlfriend, tonight he had a grand mal seizure that lasted approximately 10 minutes.  Per report he hit his face on the pavement.  On awakening, he was briefly postictal and panicky.  He attempted to head back to his girlfriend, he got up and ran away.  Per girlfriend he came to be nauseous and vomiting, the past 3 nights however last night he had no emesis.  She denies any recent fever but states he has had a lot of sweating.  She states she has had no coughing, shortness of breath, wheezing.  He is on no over-the-counter medication or activity changing prescribed medication.  She denies drug usage or alcohol usage on the patient's part.   During the interview, patient is still interactive.  He appears somnolent with benign presentation.  He spontaneously moves his legs and arms, withdraws to pain but will not speak.  He appears to understand  Review of Systems:  The patient denies anorexia, fever, weight loss,, vision loss, decreased hearing, hoarseness, chest pain, syncope, dyspnea on exertion, peripheral edema, balance deficits, hemoptysis, abdominal pain, melena, hematochezia, severe indigestion/heartburn, hematuria, incontinence, genital sores, muscle weakness, suspicious skin lesions, transient blindness, difficulty walking, depression, unusual weight change, abnormal bleeding, enlarged lymph nodes, angioedema, and breast masses. Positive: Seizures, nausea, vomiting, chills  Past Medical History: Past Medical History:  Diagnosis Date   Seizures    Past surgical history: None  Medications: Prior to Admission medications   Medication Sig Start Date End Date Taking? Authorizing Provider  diclofenac (VOLTAREN)  50 MG EC tablet Take 1 tablet (50 mg total) by mouth 2 (two) times daily. Patient not taking: Reported on 02/18/2020 06/02/16   Janne Napoleon, NP    Allergies:   Allergies  Allergen Reactions   Penicillins Other (See Comments)    Childhood reaction    Social History:  reports that he has been smoking. He has never used smokeless tobacco. He reports current alcohol use. He reports current drug use.  Family History: HTN  Physical Exam: Vitals:   01/09/23 1615 01/09/23 1630 01/09/23 1740 01/09/23 1900  BP: (!) 146/103 (!) 157/112  (!) 154/113  Pulse: 91 87  88  Resp: Temp:   98.1 F (36.7 C)   TempSrc:   Oral   SpO2: 97% 99%  100%    General: Not very interactive.  But benign presentation Eyes: no scleral icterus ENT: Left sided facial lac and bruising. Suture above left eye on lid. Abrasion on bridge of nose - sutures Cardiovascular: regular rate and rhythm, no regurgitation, no gallops, no murmurs. No carotid bruits, no JVD Abdomen: soft, positive BS, non-tender, non-distended, no organomegaly, not an acute abdomen GU: not examined Neuro: CN II - XII grossly intact, sensation intact Musculoskeletal: strength 5/5 all extremities, no clubbing, cyanosis or edema Skin: no rash, no subcutaneous crepitation, no decubitus Psych: appropriate patient   Labs on Admission:  Recent Labs    01/09/23 1458  NA 133*  K 4.1  CL 94*  CO2 26  GLUCOSE 83  BUN 9  CREATININE 1.41*  CALCIUM 9.5   Recent Labs    01/09/23 1458  AST 35  ALT 17  ALKPHOS 79  BILITOT 0.6  PROT 7.3  ALBUMIN 4.1    Recent Labs    01/09/23 1458  WBC 14.1*  NEUTROABS 12.5*  HGB 15.7  HCT 45.9  MCV 90.9  PLT 278     Radiological Exams on Admission: No results found.  Assessment/Plan Present on Admission: Seizure -Seen by neurology in the ER. -EEG done -Urine drug screen ordered, results pending -MRI brain ordered -Patient Keppra loaded, 0.5 IV Ativan given  Left corneal  abrasion -Erythromycin ointment ordered  Left eyebrow laceration -She just placed by EDP  Tobacco use -nicotine patch  History of polysubstance abuse  Nahomy Limburg 01/09/2023, 9:01 PM

## 2023-01-09 NOTE — ED Triage Notes (Signed)
Pt BIB EMS, had a seizure, fell and hit his face into a brick wall.  Has a hx of seizures, but haven't been on any medications for them.  Patient states they are very infrequent.

## 2023-01-09 NOTE — ED Notes (Signed)
ED TO INPATIENT HANDOFF REPORT  ED Nurse Name and Phone #: Frederica Kuster  S Name/Age/Gender Jeffrey Rios 37 y.o. male Room/Bed: 001C/001C  Code Status   Code Status: Not on file  Home/SNF/Other Home Patient oriented to: situation Is this baseline? Yes   Triage Complete: Triage complete  Chief Complaint Seizures [R56.9]  Triage Note Pt BIB EMS, had a seizure, fell and hit his face into a brick wall.  Has a hx of seizures, but haven't been on any medications for them.  Patient states they are very infrequent.    Allergies Allergies  Allergen Reactions   Penicillins Other (See Comments)    Childhood reaction    Level of Care/Admitting Diagnosis ED Disposition     ED Disposition  Admit   Condition  --   Comment  Hospital Area: [facility] [[id number]]  Level of Care: Progressive [102]  Admit to Progressive based on following criteria: NEUROLOGICAL AND NEUROSURGICAL complex patients with significant risk of instability, who do not meet ICU criteria, yet require close observation or frequent assessment (< / = every 2 - 4 hours) with medical / nursing intervention.  May place patient in observation at [facility] or [facility] if equivalent level of care is available:: No  Covid Evaluation: Confirmed COVID Negative  Diagnosis: Seizures [205091]  Admitting Physician: Gery Pray [[id number]]  Attending Physician: Gery Pray [[id number]]          B Medical/Surgery History Past Medical History:  Diagnosis Date   Seizures    No past surgical history on file.   A IV Location/Drains/Wounds Patient Lines/Drains/Airways Status     Active Line/Drains/Airways     Name Placement date Placement time Site Days   Peripheral IV --  --  --  --   Peripheral IV 01/09/23 Anterior;Distal;Right;Upper Arm 01/09/23  --  Arm  less than 1            Intake/Output Last 24 hours  Intake/Output Summary (Last 24 hours) at 01/09/2023 2217 Last data filed at 01/09/2023  1845 Gross per 24 hour  Intake 250 ml  Output --  Net 250 ml    Labs/Imaging Results for orders placed or performed during the hospital encounter of 01/09/23 (from the past 48 hour(s))  CBC with Differential     Status: Abnormal   Collection Time: 01/09/23  2:58 PM  Result Value Ref Range   WBC 14.1 (H) 4.0 - 10.5 K/uL   RBC 5.05 4.22 - 5.81 MIL/uL   Hemoglobin 15.7 13.0 - 17.0 g/dL   HCT 29.5 62.1 - 30.8 %   MCV 90.9 80.0 - 100.0 fL   MCH 31.1 26.0 - 34.0 pg   MCHC 34.2 30.0 - 36.0 g/dL   RDW 65.7 84.6 - 96.2 %   Platelets 278 150 - 400 K/uL   nRBC 0.0 0.0 - 0.2 %   Neutrophils Relative % 88 %   Neutro Abs 12.5 (H) 1.7 - 7.7 K/uL   Lymphocytes Relative 6 %   Lymphs Abs 0.8 0.7 - 4.0 K/uL   Monocytes Relative 5 %   Monocytes Absolute 0.7 0.1 - 1.0 K/uL   Eosinophils Relative 1 %   Eosinophils Absolute 0.1 0.0 - 0.5 K/uL   Basophils Relative 0 %   Basophils Absolute 0.0 0.0 - 0.1 K/uL   Immature Granulocytes 0 %   Abs Immature Granulocytes 0.06 0.00 - 0.07 K/uL    Comment: Performed at [facility], [address],  [place], [place] [address]  Comprehensive metabolic panel     Status: Abnormal   Collection Time: 01/09/23  2:58 PM  Result Value Ref Range   Sodium 133 (L) 135 - 145 mmol/L   Potassium 4.1 3.5 - 5.1 mmol/L   Chloride 94 (L) 98 - 111 mmol/L   CO2 26 22 - 32 mmol/L   Glucose, Bld 83 70 - 99 mg/dL    Comment: Glucose reference range applies only to samples taken after fasting for at least 8 hours.   BUN 9 6 - 20 mg/dL   Creatinine, Ser 6.04 (H) 0.61 - 1.24 mg/dL   Calcium 9.5 8.9 - 54.0 mg/dL   Total Protein 7.3 6.5 - 8.1 g/dL   Albumin 4.1 3.5 - 5.0 g/dL   AST 35 15 - 41 U/L   ALT 17 0 - 44 U/L   Alkaline Phosphatase 79 38 - 126 U/L   Total Bilirubin 0.6 0.3 - 1.2 mg/dL   GFR, Estimated >98 >11 mL/min    Comment: (NOTE) Calculated using the CKD-EPI Creatinine Equation (2021)    Anion gap 13 5 - 15    Comment: Performed at [facility], [address], [place], [place] [address]  Ethanol     Status: None   Collection Time: 01/09/23  2:58 PM  Result Value Ref Range   Alcohol, Ethyl (B) <10 <10 mg/dL    Comment: (NOTE) Lowest detectable limit for serum alcohol is 10 mg/dL.  For medical purposes only. Performed at [facility], [address] [place], [place] [address]   POC CBG, ED     Status: None   Collection Time: 01/09/23  3:02 PM  Result Value Ref Range   Glucose-Capillary 86 70 - 99 mg/dL    Comment: Glucose reference range applies only to samples taken after fasting for at least 8 hours.   CT Head Wo Contrast  Result Date: 01/09/2023 CLINICAL DATA:  Trauma EXAM: CT HEAD WITHOUT CONTRAST CT MAXILLOFACIAL WITHOUT CONTRAST TECHNIQUE: Multidetector CT imaging of the head and maxillofacial structures were performed using the standard protocol without intravenous contrast. Multiplanar CT image reconstructions of the maxillofacial structures were also generated. RADIATION DOSE REDUCTION: This exam was performed according to the departmental dose-optimization program which includes automated exposure control, adjustment of the mA and/or kV according to patient size and/or use of iterative reconstruction technique. COMPARISON:  None Available. FINDINGS: CT HEAD FINDINGS Brain: No evidence of acute infarction, hemorrhage, hydrocephalus, extra-axial collection or mass lesion/mass effect. Vascular: No hyperdense vessel or unexpected calcification. Skull: Normal. Negative for fracture or focal lesion. Other: None. CT MAXILLOFACIAL FINDINGS Osseous: No fracture or mandibular dislocation. No destructive process. Orbits: Negative. No traumatic or inflammatory finding.1 Sinuses: No middle ear or mastoid effusion. Mild pansinus mucosal thickening. Orbits are unremarkable. Soft tissues: Soft tissue injury with subcutaneous gas along the infraorbital soft tissues on the left. No evidence of an underlying facial bone fracture.  IMPRESSION: 1. No acute intracranial abnormality. 2. Soft tissue injury with subcutaneous gas along the infraorbital soft tissues on the left. No evidence of an underlying facial bone fracture. Electronically Signed   By: Lorenza Cambridge M.D.   On: 01/09/2023 14:46   CT Maxillofacial Wo Contrast  Result Date: 01/09/2023 CLINICAL DATA:  Trauma EXAM: CT HEAD WITHOUT CONTRAST CT MAXILLOFACIAL WITHOUT CONTRAST TECHNIQUE: Multidetector CT imaging of the head and maxillofacial structures were performed using the standard protocol without intravenous contrast. Multiplanar CT image reconstructions of the maxillofacial structures  were also generated. RADIATION DOSE REDUCTION: This exam was performed according to the departmental dose-optimization program which includes automated exposure control, adjustment of the mA and/or kV according to patient size and/or use of iterative reconstruction technique. COMPARISON:  None Available. FINDINGS: CT HEAD FINDINGS Brain: No evidence of acute infarction, hemorrhage, hydrocephalus, extra-axial collection or mass lesion/mass effect. Vascular: No hyperdense vessel or unexpected calcification. Skull: Normal. Negative for fracture or focal lesion. Other: None. CT MAXILLOFACIAL FINDINGS Osseous: No fracture or mandibular dislocation. No destructive process. Orbits: Negative. No traumatic or inflammatory finding.1 Sinuses: No middle ear or mastoid effusion. Mild pansinus mucosal thickening. Orbits are unremarkable. Soft tissues: Soft tissue injury with subcutaneous gas along the infraorbital soft tissues on the left. No evidence of an underlying facial bone fracture. IMPRESSION: 1. No acute intracranial abnormality. 2. Soft tissue injury with subcutaneous gas along the infraorbital soft tissues on the left. No evidence of an underlying facial bone fracture. Electronically Signed   By: Lorenza Cambridge M.D.   On: 01/09/2023 14:46    Pending Labs Unresulted Labs (From admission, onward)      Start     Ordered   01/09/23 2150  CK  Once,   R        01/09/23 2149   01/09/23 1337  Urinalysis, Routine w reflex microscopic -Urine, Clean Catch  Once,   URGENT       Question Answer Comment  Specimen Source Urine, Clean Catch   Release to patient Immediate      01/09/23 1336   01/09/23 1337  Rapid urine drug screen (hospital performed)  ONCE - STAT,   STAT       Question:  Release to patient  Answer:  Immediate   01/09/23 1336            Vitals/Pain Today's Vitals   01/09/23 1740 01/09/23 1900 01/09/23 2000 01/09/23 2100  BP:  (!) 154/113 (!) 158/107 (!) 158/113  Pulse:  88 86 85  Resp:  15 15 14   Temp: 98.1 F (36.7 C)     TempSrc: Oral     SpO2:  100% 100% 100%  PainSc:        Isolation Precautions No active isolations  Medications Medications  Tdap (BOOSTRIX) injection 0.5 mL (0.5 mLs Intramuscular Not Given 01/09/23 1820)  lidocaine (PF) (XYLOCAINE) 1 % injection (has no administration in time range)  erythromycin ophthalmic ointment 1 Application (has no administration in time range)  0.9 %  sodium chloride infusion (has no administration in time range)  sodium chloride 0.9 % bolus 1,000 mL (has no administration in time range)  LORazepam (ATIVAN) injection 2 mg (has no administration in time range)  nicotine (NICODERM CQ - dosed in mg/24 hours) patch 14 mg (has no administration in time range)  fluorescein ophthalmic strip 1 strip (1 strip Left Eye Given 01/09/23 1819)  tetracaine (PONTOCAINE) 0.5 % ophthalmic solution 2 drop (2 drops Left Eye Given 01/09/23 1820)  HYDROcodone-acetaminophen (NORCO/VICODIN) 5-325 MG per tablet 1 tablet (1 tablet Oral Given 01/09/23 1353)  levETIRAcetam (KEPPRA) 3,000 mg in sodium chloride 0.9 % 250 mL IVPB (0 mg Intravenous Stopped 01/09/23 1845)  lidocaine (PF) (XYLOCAINE) 1 % injection 10 mL (10 mLs Infiltration Given 01/09/23 1819)  LORazepam (ATIVAN) injection 0.5 mg (0.5 mg Intravenous Given 01/09/23 1817)     Mobility walks     Focused Assessments Neuro Assessment Handoff:  Swallow screen pass? Yes          Neuro Assessment:  Neuro Checks:      Has TPA been given? No If patient is a Neuro Trauma and patient is going to OR before floor call report to 4N Charge nurse: [phone] or [phone]   R Recommendations: See Admitting Provider Note  Report given to:   Additional Notes: N/A

## 2023-01-09 NOTE — ED Notes (Signed)
Pt to MRI

## 2023-01-10 ENCOUNTER — Encounter (HOSPITAL_COMMUNITY): Payer: Self-pay | Admitting: Family Medicine

## 2023-01-10 ENCOUNTER — Observation Stay (HOSPITAL_COMMUNITY): Payer: Self-pay

## 2023-01-10 LAB — CBC WITH DIFFERENTIAL/PLATELET
Abs Immature Granulocytes: 0.02 10*3/uL (ref 0.00–0.07)
Basophils Absolute: 0 10*3/uL (ref 0.0–0.1)
Basophils Relative: 0 %
Eosinophils Absolute: 0.2 10*3/uL (ref 0.0–0.5)
Eosinophils Relative: 2 %
HCT: 46 % (ref 39.0–52.0)
Hemoglobin: 15.4 g/dL (ref 13.0–17.0)
Immature Granulocytes: 0 %
Lymphocytes Relative: 20 %
Lymphs Abs: 1.6 10*3/uL (ref 0.7–4.0)
MCH: 30.6 pg (ref 26.0–34.0)
MCHC: 33.5 g/dL (ref 30.0–36.0)
MCV: 91.5 fL (ref 80.0–100.0)
Monocytes Absolute: 0.6 10*3/uL (ref 0.1–1.0)
Monocytes Relative: 7 %
Neutro Abs: 5.8 10*3/uL (ref 1.7–7.7)
Neutrophils Relative %: 71 %
Platelets: 293 10*3/uL (ref 150–400)
RBC: 5.03 MIL/uL (ref 4.22–5.81)
RDW: 12.2 % (ref 11.5–15.5)
WBC: 8.2 10*3/uL (ref 4.0–10.5)
nRBC: 0 % (ref 0.0–0.2)

## 2023-01-10 LAB — BASIC METABOLIC PANEL
Anion gap: 8 (ref 5–15)
BUN: 9 mg/dL (ref 6–20)
CO2: 25 mmol/L (ref 22–32)
Calcium: 8.7 mg/dL — ABNORMAL LOW (ref 8.9–10.3)
Chloride: 100 mmol/L (ref 98–111)
Creatinine, Ser: 1.09 mg/dL (ref 0.61–1.24)
GFR, Estimated: >60 mL/min (ref >60)
Glucose, Bld: 104 mg/dL — ABNORMAL HIGH (ref 70–99)
Potassium: 3.8 mmol/L (ref 3.5–5.1)
Sodium: 133 mmol/L — ABNORMAL LOW (ref 135–145)

## 2023-01-10 LAB — URINALYSIS, ROUTINE W REFLEX MICROSCOPIC
Bilirubin Urine: NEGATIVE
Glucose, UA: NEGATIVE mg/dL
Hgb urine dipstick: NEGATIVE
Ketones, ur: NEGATIVE mg/dL
Leukocytes,Ua: NEGATIVE
Nitrite: NEGATIVE
Protein, ur: NEGATIVE mg/dL
Specific Gravity, Urine: 1.023 (ref 1.005–1.030)
pH: 6 (ref 5.0–8.0)

## 2023-01-10 LAB — RAPID URINE DRUG SCREEN, HOSP PERFORMED
Amphetamines: POSITIVE — AB
Barbiturates: NOT DETECTED
Benzodiazepines: POSITIVE — AB
Cocaine: POSITIVE — AB
Opiates: POSITIVE — AB
Tetrahydrocannabinol: POSITIVE — AB

## 2023-01-10 LAB — CK: Total CK: 840 U/L — ABNORMAL HIGH (ref 49–397)

## 2023-01-10 MED ORDER — HYDROCODONE-ACETAMINOPHEN 5-325 MG PO TABS
1.0000 | ORAL_TABLET | Freq: Four times a day (QID) | ORAL | Status: DC | PRN
  Administered 2023-01-10 – 2023-01-11 (×3): 1 via ORAL
  Administered 2023-01-11: 2 via ORAL
  Filled 2023-01-10 (×2): qty 1
  Filled 2023-01-10: qty 2
  Filled 2023-01-10: qty 1

## 2023-01-10 MED ORDER — LEVETIRACETAM 500 MG PO TABS
500.0000 mg | ORAL_TABLET | Freq: Two times a day (BID) | ORAL | Status: DC
  Administered 2023-01-10 – 2023-01-11 (×3): 500 mg via ORAL
  Filled 2023-01-10 (×3): qty 1

## 2023-01-10 MED ORDER — GADOBUTROL 1 MMOL/ML IV SOLN
7.2000 mL | Freq: Once | INTRAVENOUS | Status: AC | PRN
  Administered 2023-01-10: 7.2 mL via INTRAVENOUS

## 2023-01-10 MED ORDER — OXYCODONE HCL 5 MG PO TABS
5.0000 mg | ORAL_TABLET | Freq: Four times a day (QID) | ORAL | Status: DC | PRN
  Administered 2023-01-10: 5 mg via ORAL
  Filled 2023-01-10: qty 1

## 2023-01-10 MED ORDER — CEFDINIR 300 MG PO CAPS
300.0000 mg | ORAL_CAPSULE | Freq: Two times a day (BID) | ORAL | Status: DC
  Administered 2023-01-10 – 2023-01-11 (×3): 300 mg via ORAL
  Filled 2023-01-10 (×4): qty 1

## 2023-01-10 MED ORDER — LORAZEPAM 2 MG/ML IJ SOLN
1.0000 mg | Freq: Once | INTRAMUSCULAR | Status: AC
  Administered 2023-01-10: 1 mg via INTRAVENOUS
  Filled 2023-01-10: qty 1

## 2023-01-10 MED ORDER — SULFAMETHOXAZOLE-TRIMETHOPRIM 800-160 MG PO TABS
1.0000 | ORAL_TABLET | Freq: Two times a day (BID) | ORAL | Status: DC
  Administered 2023-01-10 – 2023-01-11 (×3): 1 via ORAL
  Filled 2023-01-10 (×4): qty 1

## 2023-01-10 NOTE — Procedures (Signed)
History: 37 year old male with a history of seizures  Sedation: Loaded with Keppra and received Ativan shortly before EEG  Technique: This EEG was acquired with electrodes placed according to the International 10-20 electrode system (including Fp1, Fp2, F3, F4, C3, C4, P3, P4, O1, O2, T3, T4, T5, T6, A1, A2, Fz, Cz, Pz). The following electrodes were missing or displaced: none.   Background: The predominance of this EEG was performed during drowsiness or early sleep.  During maximal wakefulness, there is a posterior dominant rhythm of 8 to 9 Hz which is reactive.  There are is generalized irregular slow activity intruding into the background even during periods of maximal wakefulness.  No epileptiform discharges were seen  Photic stimulation: Physiologic driving is not performed  EEG Abnormalities: 1) generalized irregular slow activity  Clinical Interpretation: This EEG is consistent with a mild generalized nonspecific cerebral dysfunction (encephalopathy).  Though nonspecific, this can be consistent with postictal state, or the effect of sedating medication.  There was no seizure or seizure predisposition recorded on this study. Please note that lack of epileptiform activity on EEG does not preclude the possibility of epilepsy.   Ritta Slot, MD [facility] [phone]  If 7pm- 7am, please page neurology on call as listed in AMION.

## 2023-01-10 NOTE — Plan of Care (Signed)

## 2023-01-10 NOTE — Progress Notes (Signed)
PROGRESS NOTE    Jeffrey Rios  ZOX:[id number] DOB: 16-Apr-1986 DOA: 01/09/2023 PCP: Patient, No Pcp Per   Brief Narrative: 37 year old with past medical history significant for seizure present after a seizure episode, grand mal seizure lasted 3-5  minutes, he fell and hit his face with brisk wall.  Due to financial reason he has not being able to afford doctor's visit and has not been on any anticonvulsant. He was loaded with IV Keppra in the ED and subsequently is started on Keppra 500 mg twice daily.  He was found to have left periocular trauma and corneal abrasion for which ophthalmology was consulted.     Assessment & Plan:   Principal Problem:   Seizures Active Problems:   Seizure   1-Seizure;  Patient presented after having a seizure generalized tonic-clonic.  EEG: Mild generalized nonspecific cerebral dysfunction.  Could be consistent with post ictal state Loaded in the ED with Keppra.  Started on keppra 500 mg BID.  Appreciate Neurology recommendation.  MRI ordered. Couldn't tolerate MRI last night, he will try today.   2-Left Periocular trauma,  CT negative for fracture.  Ophthalmology consulted. No other ocular injury.  Laceration close in the ED.  He has edema and redness. I have started antibiotics cefdinir and Bactrim for 5 days.   3-Corneal Abrasion and subconjunctival Hemorrhage.  Evaluated by ophthalmology. Generally self limited.  Continue with Erythromycin ointment.   4-Tabacco use; nicotine patch.  History of polysubstance abuse.    Estimated body mass index is 24.9 kg/m as calculated from the following:   Height as of this encounter:  (1.702 m).   Weight as of this encounter: 72.1 kg. AKI; in setting dehydration.  Mild elevation CK.  Hyponatremia; continue with IV fluids.   DVT prophylaxis: SCD Code Status: Full code Family Communication: care discussed with patient  Disposition Plan:  Status is: Observation The patient remains OBS  appropriate and will d/c before 2 midnights.    Consultants:  Neurology   Procedures:  EEG  Antimicrobials:    Subjective: He is alert, conversant, report vicodin helps more with pain than Oxycodone.  He has left side face pain.   Objective: Vitals:   01/09/23 2100 01/10/23 0000 01/10/23 0002 01/10/23 0259  BP: (!) 158/113 (!) 147/113 (!) 147/113 (!) 152/99  Pulse: 85 81 81 85  Resp: 14 (!) 21 (!) 21 13  Temp:  98.7 F (37.1 C) 98.7 F (37.1 C) 98 F (36.7 C)  TempSrc:  Oral  Oral  SpO2: 100% 97%  97%  Weight:  72.1 kg    Height:   (1.702 m)      Intake/Output Summary (Last 24 hours) at 01/10/2023 0726 Last data filed at 01/10/2023 0600 Gross per 24 hour  Intake 891.39 ml  Output 100 ml  Net 791.39 ml   Filed Weights   01/10/23 0000  Weight: 72.1 kg    Examination:  General exam: Appears calm and comfortable left eye lid edema, abrasions. , suture in left brow and left canthal area.  Respiratory system: Clear to auscultation. Respiratory effort normal. Cardiovascular system: S1 & S2 heard, RRR. No JVD, murmurs, rubs, gallops or clicks. No pedal edema. Gastrointestinal system: Abdomen is nondistended, soft and nontender. No organomegaly or masses felt. Normal bowel sounds heard. Central nervous system: Alert and oriented. No focal neurological deficits. Extremities: Symmetric 5 x 5 power.    Data Reviewed: I have personally reviewed following labs and imaging studies  CBC: Recent Labs  Lab 01/09/23 1458 01/10/23 0525  WBC 14.1* 8.2  NEUTROABS 12.5* 5.8  HGB 15.7 15.4  HCT 45.9 46.0  MCV 90.9 91.5  PLT 278 293   Basic Metabolic Panel: Recent Labs  Lab 01/09/23 1458 01/10/23 0525  NA 133* 133*  K 4.1 3.8  CL 94* 100  CO2 26 25  GLUCOSE 83 104*  BUN 9 9  CREATININE 1.41* 1.09  CALCIUM 9.5 8.7*   GFR: Estimated Creatinine Clearance: 86.8 mL/min (by C-G formula based on SCr of 1.09 mg/dL). Liver Function Tests: Recent Labs  Lab  01/09/23 1458  AST 35  ALT 17  ALKPHOS 79  BILITOT 0.6  PROT 7.3  ALBUMIN 4.1   No results for input(s): "LIPASE", "AMYLASE" in the last 168 hours. No results for input(s): "AMMONIA" in the last 168 hours. Coagulation Profile: No results for input(s): "INR", "PROTIME" in the last 168 hours. Cardiac Enzymes: Recent Labs  Lab 01/10/23 0059  CKTOTAL 840*   BNP (last 3 results) No results for input(s): "PROBNP" in the last 8760 hours. HbA1C: No results for input(s): "HGBA1C" in the last 72 hours. CBG: Recent Labs  Lab 01/09/23 1502  GLUCAP 86   Lipid Profile: No results for input(s): "CHOL", "HDL", "LDLCALC", "TRIG", "CHOLHDL", "LDLDIRECT" in the last 72 hours. Thyroid Function Tests: No results for input(s): "TSH", "T4TOTAL", "FREET4", "T3FREE", "THYROIDAB" in the last 72 hours. Anemia Panel: No results for input(s): "VITAMINB12", "FOLATE", "FERRITIN", "TIBC", "IRON", "RETICCTPCT" in the last 72 hours. Sepsis Labs: No results for input(s): "PROCALCITON", "LATICACIDVEN" in the last 168 hours.  No results found for this or any previous visit (from the past 240 hour(s)).       Radiology Studies: CT Head Wo Contrast  Result Date: 01/09/2023 CLINICAL DATA:  Trauma EXAM: CT HEAD WITHOUT CONTRAST CT MAXILLOFACIAL WITHOUT CONTRAST TECHNIQUE: Multidetector CT imaging of the head and maxillofacial structures were performed using the standard protocol without intravenous contrast. Multiplanar CT image reconstructions of the maxillofacial structures were also generated. RADIATION DOSE REDUCTION: This exam was performed according to the departmental dose-optimization program which includes automated exposure control, adjustment of the mA and/or kV according to patient size and/or use of iterative reconstruction technique. COMPARISON:  None Available. FINDINGS: CT HEAD FINDINGS Brain: No evidence of acute infarction, hemorrhage, hydrocephalus, extra-axial collection or mass lesion/mass  effect. Vascular: No hyperdense vessel or unexpected calcification. Skull: Normal. Negative for fracture or focal lesion. Other: None. CT MAXILLOFACIAL FINDINGS Osseous: No fracture or mandibular dislocation. No destructive process. Orbits: Negative. No traumatic or inflammatory finding.1 Sinuses: No middle ear or mastoid effusion. Mild pansinus mucosal thickening. Orbits are unremarkable. Soft tissues: Soft tissue injury with subcutaneous gas along the infraorbital soft tissues on the left. No evidence of an underlying facial bone fracture. IMPRESSION: 1. No acute intracranial abnormality. 2. Soft tissue injury with subcutaneous gas along the infraorbital soft tissues on the left. No evidence of an underlying facial bone fracture. Electronically Signed   By: Lorenza Cambridge M.D.   On: 01/09/2023 14:46   CT Maxillofacial Wo Contrast  Result Date: 01/09/2023 CLINICAL DATA:  Trauma EXAM: CT HEAD WITHOUT CONTRAST CT MAXILLOFACIAL WITHOUT CONTRAST TECHNIQUE: Multidetector CT imaging of the head and maxillofacial structures were performed using the standard protocol without intravenous contrast. Multiplanar CT image reconstructions of the maxillofacial structures were also generated. RADIATION DOSE REDUCTION: This exam was performed according to the departmental dose-optimization program which includes automated exposure control, adjustment of the mA and/or kV according to patient size and/or use  of iterative reconstruction technique. COMPARISON:  None Available. FINDINGS: CT HEAD FINDINGS Brain: No evidence of acute infarction, hemorrhage, hydrocephalus, extra-axial collection or mass lesion/mass effect. Vascular: No hyperdense vessel or unexpected calcification. Skull: Normal. Negative for fracture or focal lesion. Other: None. CT MAXILLOFACIAL FINDINGS Osseous: No fracture or mandibular dislocation. No destructive process. Orbits: Negative. No traumatic or inflammatory finding.1 Sinuses: No middle ear or mastoid  effusion. Mild pansinus mucosal thickening. Orbits are unremarkable. Soft tissues: Soft tissue injury with subcutaneous gas along the infraorbital soft tissues on the left. No evidence of an underlying facial bone fracture. IMPRESSION: 1. No acute intracranial abnormality. 2. Soft tissue injury with subcutaneous gas along the infraorbital soft tissues on the left. No evidence of an underlying facial bone fracture. Electronically Signed   By: Lorenza Cambridge M.D.   On: 01/09/2023 14:46        Scheduled Meds:  erythromycin  1 Application Left Eye Q6H   levETIRAcetam  500 mg Oral BID   nicotine  14 mg Transdermal Daily   Tdap  0.5 mL Intramuscular Once   Continuous Infusions:  sodium chloride 125 mL/hr at 01/10/23 0600   sodium chloride       LOS: 0 days    Time spent: 35 minutes    Leeanna Slaby A Arriyana Rodell, MD [facility]   If 7PM-7AM, please contact night-coverage [email or web address]  01/10/2023, 7:26 AM

## 2023-01-10 NOTE — Plan of Care (Signed)
MR brain negative for seizure focus.   EEG no focal seizure focus identified.   A/R: 37 year old male presenting with 5th seizure of lifetime. Seimiology: GTC without any synmptoms to suggest focal onset.  - Of note, patient's UDS came back positive for several substances: cocaine, amphetamine, benzo, opiates and THC.  - His seizure may have been precipitated by substances, but given 5 seizures in his lifetime, safest approach is to continue him on Keppra.  - Will need outpatient neurology or PCP follow up.  - Neurohospitalist service will sign off. Please call if there are additional questions.   Electronically signed: Dr. Caryl Pina

## 2023-01-10 NOTE — Consult Note (Signed)
Chief Complaint/Reason for Consultation: periocular injury  HPI: 37 yo M with PMH of seizures presents for consultation after a fall and left periocular trauma. Pt reportedly fell forward and hit the cornea of a brick wall and had a grand mal seizure. He sustained L brow, L lateral canthal area, and L nasal bridge lacerations, which were repaired in the ED. He denies any change in his vision, though has not been able to assess OS much due to the significant periocular edema. With eyelid manually elevated patient states his vision is "clear" without any changes. Denies diplopia. Denies flashes, floaters, curtain over the vision. Notes mild FBS.  ROS: otherwise as in HPI  Patient Active Problem List   Diagnosis Date Noted   Seizure 01/09/2023   Seizures 01/09/2023   No current facility-administered medications on file prior to encounter.   No current outpatient medications on file prior to encounter.   Allergies  Allergen Reactions   Penicillins Other (See Comments)    Childhood reaction    EXAMINATION  [facility] (near): OD: 20/20  OS: 20/20    Pupils:  OD: Equal, round, reactive, no APD OS: Equal, round, reactive, no APD  T([facility]): OD: 12   mm Hg OS:  14  mm Hg  CVF: full to finger counting OU EOM: full OU, no limitation or restriction OU  Slit lamp Exam: Ext/Lids: R: normal L: lid edema and ecchymoses, sutures in L brow and L canthal area lacerations with reasonable approximation.  Conj/Sclera: OD white and quiet OS subconj hemorrhage and mild chemosis Cornea: OD clear, no abrasion or infiltrate OS clear with small inferotemporal paracentral abrasion AC: Deep and Quiet OU, no hyphema OU Iris: Round and Flat OU, no peaking OU Lens: Clear OU  Dilated OU with phenylephrine and tropicamide OU at 1230 hours  Dilated Fundus Exam: Vitreous: Clear OU Disc: sharp and pink with c/d OD 0.5 OS 0.55 Macula: flat and dry OU, no commotio retinae OU Vessels: normal distribution OU,  perfused OU Periphery: flat and attached 360 without breaks or tears OU   Imp/Plan:  Left periocular trauma Lacerations closed in ED, pt has follow-up for suture removal No orbital fractures noted on CT Ice pack/cool compress prn may help with edema resolution No other ocular injuries noted Corneal abrasion and Subconjunctival hemorrhage OS Almost healed, generally self-limited Continue erythromycin QID OS   Dispo: Ophthalmology will sign off, reconsult or contact me if any further questions or concerns.  Shon Millet, M.D. Ophthalmology [facility]

## 2023-01-11 ENCOUNTER — Other Ambulatory Visit (HOSPITAL_COMMUNITY): Payer: Self-pay

## 2023-01-11 ENCOUNTER — Other Ambulatory Visit (HOSPITAL_BASED_OUTPATIENT_CLINIC_OR_DEPARTMENT_OTHER): Payer: Self-pay

## 2023-01-11 LAB — CBC
HCT: 44.4 % (ref 39.0–52.0)
Hemoglobin: 14.8 g/dL (ref 13.0–17.0)
MCH: 31 pg (ref 26.0–34.0)
MCHC: 33.3 g/dL (ref 30.0–36.0)
MCV: 93.1 fL (ref 80.0–100.0)
Platelets: 260 10*3/uL (ref 150–400)
RBC: 4.77 MIL/uL (ref 4.22–5.81)
RDW: 12.2 % (ref 11.5–15.5)
WBC: 6.3 10*3/uL (ref 4.0–10.5)
nRBC: 0 % (ref 0.0–0.2)

## 2023-01-11 LAB — BASIC METABOLIC PANEL
Anion gap: 12 (ref 5–15)
BUN: 14 mg/dL (ref 6–20)
CO2: 24 mmol/L (ref 22–32)
Calcium: 9.2 mg/dL (ref 8.9–10.3)
Chloride: 100 mmol/L (ref 98–111)
Creatinine, Ser: 1.36 mg/dL — ABNORMAL HIGH (ref 0.61–1.24)
GFR, Estimated: >60 mL/min (ref >60)
Glucose, Bld: 116 mg/dL — ABNORMAL HIGH (ref 70–99)
Potassium: 4.4 mmol/L (ref 3.5–5.1)
Sodium: 136 mmol/L (ref 135–145)

## 2023-01-11 LAB — CK: Total CK: 403 U/L — ABNORMAL HIGH (ref 49–397)

## 2023-01-11 MED ORDER — CEFDINIR 300 MG PO CAPS
300.0000 mg | ORAL_CAPSULE | Freq: Two times a day (BID) | ORAL | 0 refills | Status: AC
  Filled 2023-01-11: qty 8, 4d supply, fill #0

## 2023-01-11 MED ORDER — SULFAMETHOXAZOLE-TRIMETHOPRIM 800-160 MG PO TABS
1.0000 | ORAL_TABLET | Freq: Two times a day (BID) | ORAL | 0 refills | Status: AC
  Filled 2023-01-11: qty 8, 4d supply, fill #0

## 2023-01-11 MED ORDER — ERYTHROMYCIN 5 MG/GM OP OINT
1.0000 | TOPICAL_OINTMENT | Freq: Four times a day (QID) | OPHTHALMIC | 0 refills | Status: DC
  Filled 2023-01-11: qty 3.5, 30d supply, fill #0

## 2023-01-11 MED ORDER — AMLODIPINE BESYLATE 5 MG PO TABS
5.0000 mg | ORAL_TABLET | Freq: Every day | ORAL | Status: DC
  Administered 2023-01-11: 5 mg via ORAL
  Filled 2023-01-11: qty 1

## 2023-01-11 MED ORDER — AMLODIPINE BESYLATE 5 MG PO TABS
5.0000 mg | ORAL_TABLET | Freq: Every day | ORAL | 2 refills | Status: DC
  Filled 2023-01-11: qty 30, 30d supply, fill #0

## 2023-01-11 MED ORDER — HYDROCODONE-ACETAMINOPHEN 5-325 MG PO TABS
1.0000 | ORAL_TABLET | Freq: Four times a day (QID) | ORAL | 0 refills | Status: AC | PRN
  Filled 2023-01-11: qty 16, 2d supply, fill #0

## 2023-01-11 MED ORDER — LEVETIRACETAM 500 MG PO TABS
500.0000 mg | ORAL_TABLET | Freq: Two times a day (BID) | ORAL | 2 refills | Status: DC
  Filled 2023-01-11: qty 60, 30d supply, fill #0

## 2023-01-11 NOTE — Progress Notes (Signed)
Pt discharged and transported off unit via wheelchair with belongings and significant other the side by SWOT RN to discharge lounge to waiting on TOC medication. Dionne Bucy RN

## 2023-01-11 NOTE — TOC Transition Note (Signed)
Transition of Care (TOC) - CM/SW Discharge Note Donn Pierini RN,BSN Transitions of Care Unit 4NP (Non Trauma)- RN Case Manager See Treatment Team for direct Phone #   Patient Details  Name: Jeffrey Rios MRN: [id number] Date of Birth: 1986-08-25  Transition of Care [facility]) CM/SW Contact:  Darrold Span, RN Phone Number: 01/11/2023, 1:16 PM   Clinical Narrative:    Pt stable for transition home, pt has been living in motel.   CM has made f/u appointment for PCP needs w/ [facility] for May 10. Pt aware and CM has explained to pt importance of keeping appointment as well as clinic can assist him with further medication needs and assistance.   TOC pharmacy filling meds for discharge, call made for cost and total was around $73, pt unable to afford this cost and CM has placed pt into MATCH system for medication assistance- program explained to pt as well as reduced cost of $4 per script. TOC to re-process meds under MATCH assist pt with meds for discharge.   Resources have been placed on AVS for substance abuse per CSW.    Final next level of care: Home/Self Care Barriers to Discharge: No Barriers Identified   Patient Goals and CMS Choice CMS Medicare.gov Compare Post Acute Care list provided to:: Patient Choice offered to / list presented to : NA  Discharge Placement               Home          Discharge Plan and Services Additional resources added to the After Visit Summary for   In-house Referral: Clinical Social Work Discharge Planning Services: MATCH Program, Medication Assistance Post Acute Care Choice: NA          DME Arranged: N/A DME Agency: NA       HH Arranged: NA HH Agency: NA        Social Determinants of Health (SDOH) Interventions SDOH Screenings   Food Insecurity: Food Insecurity Present (01/09/2023)  Housing: Medium Risk (01/09/2023)  Transportation Needs: Unmet Transportation Needs (01/09/2023)  Utilities: At Risk (01/09/2023)  Tobacco Use:  High Risk (01/10/2023)     Readmission Risk Interventions     No data to display

## 2023-01-11 NOTE — Progress Notes (Signed)
Pt discharge education and instructions completed. Pt voices understanding and denies any more concerns after all questions and concerns addressed. Pt IV and telemetry removed. Pt awaiting on prescriptions from [facility] and requested to see case manager prior to leaving. CM aware and to come see pt. Pt to be transported off unit afterwards. Will continue to monitor pt. Dionne Bucy RN

## 2023-01-11 NOTE — TOC CAGE-AID Note (Signed)
Transition of Care [facility]) - CAGE-AID Screening  Patient Details  Name: Jeffrey Rios MRN: [id number] Date of Birth: 04-11-1986  Clinical Narrative:  Patient to ED after a seizure. UDS positive for multiple substances. Patient was offered resources but denies need.  CAGE-AID Screening:   Have You Ever Felt You Ought to Cut Down on Your Drinking or Drug Use?: No Have People Annoyed You By Critizing Your Drinking Or Drug Use?: No Have You Felt Bad Or Guilty About Your Drinking Or Drug Use?: No Have You Ever Had a Drink or Used Drugs First Thing In The Morning to Steady Your Nerves or to Get Rid of a Hangover?: No CAGE-AID Score: 0  Substance Abuse Education Offered: Yes
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
## 2023-01-11 NOTE — Discharge Instructions (Addendum)
Needs suture removal in 3- to 5 days.   Per [facility] statutes, patients with seizures are not allowed to drive until  they have been seizure-free for six months. Use caution when using heavy equipment or power tools. Avoid working on ladders or at heights. Take showers instead of baths. Ensure the water temperature is not too high on the home water heater. Do not go swimming alone. When caring for infants or small children, sit down when holding, feeding, or changing them to minimize risk of injury to the child in the event you have a seizure. Also, Maintain good sleep hygiene. Avoid alcohol.

## 2023-01-11 NOTE — Progress Notes (Signed)
Message sent to MD and CM ---pt will need to f/u with outpatient neurology or PCP upon discharge for continue care of seizures.    Pt's unit primary nurse states pt had labs drawn this am, will need medication assist today at d/c and post discharge.  Pt not ready for discharge at this time.

## 2023-01-12 ENCOUNTER — Other Ambulatory Visit: Payer: Self-pay

## 2023-01-12 ENCOUNTER — Other Ambulatory Visit (HOSPITAL_COMMUNITY): Payer: Self-pay

## 2023-01-12 ENCOUNTER — Emergency Department (HOSPITAL_COMMUNITY)
Admission: EM | Admit: 2023-01-12 | Discharge: 2023-01-12 | Discharge status: Against Medical Advice | Payer: Self-pay | Attending: Emergency Medicine | Admitting: Emergency Medicine

## 2023-01-12 ENCOUNTER — Other Ambulatory Visit (HOSPITAL_BASED_OUTPATIENT_CLINIC_OR_DEPARTMENT_OTHER): Payer: Self-pay

## 2023-01-12 ENCOUNTER — Encounter (HOSPITAL_COMMUNITY): Payer: Self-pay

## 2023-01-12 DIAGNOSIS — T40601A Poisoning by unspecified narcotics, accidental (unintentional), initial encounter: Secondary | ICD-10-CM | POA: Insufficient documentation

## 2023-01-12 DIAGNOSIS — T50901A Poisoning by unspecified drugs, medicaments and biological substances, accidental (unintentional), initial encounter: Secondary | ICD-10-CM

## 2023-01-12 LAB — CBG MONITORING, ED: Glucose-Capillary: 141 mg/dL — ABNORMAL HIGH (ref 70–99)

## 2023-01-12 MED ORDER — SODIUM CHLORIDE 0.9 % IV BOLUS: 1000.0000 mL | Freq: Once | INTRAVENOUS | Status: DC

## 2023-01-12 NOTE — ED Notes (Signed)
Pt states he is going to leave facility and does not want treatment. Dr. Rodena Medin notified. Pt told to wait for Dr. Rodena Medin to speak with him prior to leaving

## 2023-01-12 NOTE — ED Notes (Signed)
Pt left cell phone, pt phone placed in bag with sticker on it and put in cabinet for rooms 1-4

## 2023-01-12 NOTE — ED Provider Notes (Signed)
[facility] EMERGENCY DEPARTMENT AT [facility] Provider Note   CSN: [id number] Arrival date & time: 01/12/23  1126     History  Chief Complaint  Patient presents with   Drug Overdose    DARWYN PONZO is a 37 y.o. male.  37 year old male with prior medical history as detailed below presents for evaluation.  Patient arrives with EMS.  Patient was found unresponsive in a local parking lot.  EMS administered Narcan in the field.  Patient did require ventilatory assistance prior to arrival.  On arrival to the ED the Narcan has had a good effect.  Patient is awake and alert and talking.  He is without complaint.  The history is provided by the patient and medical records.       Home Medications Prior to Admission medications   Medication Sig Start Date End Date Taking? Authorizing Provider  amLODipine (NORVASC) 5 MG tablet Take 1 tablet (5 mg total) by mouth daily. 01/11/23   Regalado, Belkys A, MD  cefdinir (OMNICEF) 300 MG capsule Take 1 capsule (300 mg total) by mouth every 12 (twelve) hours for 4 days. 01/11/23 01/15/23  Regalado, Jon Billings A, MD  erythromycin ophthalmic ointment Place 1 application into the left eye every 6 (six) hours. 01/11/23   Regalado, Belkys A, MD  HYDROcodone-acetaminophen (NORCO/VICODIN) 5-325 MG tablet Take 1-2 tablets by mouth every 6 (six) hours as needed for up to 2 days for moderate pain. 01/11/23 01/13/23  Regalado, Jon Billings A, MD  levETIRAcetam (KEPPRA) 500 MG tablet Take 1 tablet (500 mg total) by mouth 2 (two) times daily. 01/11/23   Regalado, Belkys A, MD  sulfamethoxazole-trimethoprim (BACTRIM DS) 800-160 MG tablet Take 1 tablet by mouth every 12 (twelve) hours for 4 days. 01/11/23 01/15/23  Regalado, Prentiss Bells, MD      Allergies    Penicillins    Review of Systems   Review of Systems  All other systems reviewed and are negative.   Physical Exam Updated Vital Signs BP (!) 152/106 (BP Location: Right Arm)   Pulse (!) 110   Temp 97.7  F (36.5 C) (Oral)   Resp 17   Ht  (1.702 m)   Wt 72.1 kg   SpO2 100%   BMI 24.90 kg/m  Physical Exam Vitals and nursing note reviewed.  Constitutional:      General: He is not in acute distress.    Appearance: Normal appearance. He is well-developed.  HENT:     Head: Normocephalic.     Comments: Healing lacerations and abrasions to face. Eyes:     Conjunctiva/sclera: Conjunctivae normal.     Pupils: Pupils are equal, round, and reactive to light.  Cardiovascular:     Rate and Rhythm: Normal rate and regular rhythm.     Heart sounds: Normal heart sounds.  Pulmonary:     Effort: Pulmonary effort is normal. No respiratory distress.     Breath sounds: Normal breath sounds.  Abdominal:     General: There is no distension.     Palpations: Abdomen is soft.     Tenderness: There is no abdominal tenderness.  Musculoskeletal:        General: No deformity. Normal range of motion.     Cervical back: Normal range of motion and neck supple.  Skin:    General: Skin is warm and dry.  Neurological:     General: No focal deficit present.     Mental Status: He is alert and oriented to person, place,  and time.     ED Results / Procedures / Treatments   Labs (all labs ordered are listed, but only abnormal results are displayed) Labs Reviewed  CBG MONITORING, ED - Abnormal; Notable for the following components:      Result Value   Glucose-Capillary 141 (*)    All other components within normal limits  CBC WITH DIFFERENTIAL/PLATELET  BASIC METABOLIC PANEL  ETHANOL    EKG None  Radiology No results found.  Procedures Procedures    Medications Ordered in ED Medications  sodium chloride 0.9 % bolus 1,000 mL (has no administration in time range)    ED Course/ Medical Decision Making/ A&P                             Medical Decision Making Amount and/or Complexity of Data Reviewed Labs: ordered. Radiology: ordered.    Medical Screen Complete  This patient  presented to the ED with complaint of suspected overdose.  This complaint involves an extensive number of treatment options. The initial differential diagnosis includes, but is not limited to, overdose  This presentation is: Acute, Self-Limited, Previously Undiagnosed, Uncertain Prognosis, Complicated, Systemic Symptoms, and Threat to Life/Bodily Function  Patient presents with EMS after apparent overdose.  Patient received Narcan prior to arrival.  With Narcan patient's mentation significantly improved per EMS.  Patient required additional observation and workup in the ED.  Shortly after initial evaluation the patient got up and left the department.  Nursing staff informed this provider that he left after the patient had already left.  Patient had capacity to make medical decisions.  Patient had capacity to leave AGAINST MEDICAL ADVICE.  Given that provider was informed of his leaving after he had left I was unable to try to convince the patient to stay for observation.  Additional history obtained:  Additional history obtained from EMS External records from outside sources obtained and reviewed including prior ED visits and prior Inpatient records.   Problem List / ED Course:  Suspected narcotic overdose   Reevaluation:  After the interventions noted above, I reevaluated the patient and found that they have: improved   Disposition:  After consideration of the diagnostic results and the patients response to treatment, I feel that the patent would benefit from completion of ED evaluation.          Final Clinical Impression(s) / ED Diagnoses Final diagnoses:  Accidental overdose, initial encounter    Rx / DC Orders ED Discharge Orders     None         Wynetta Fines, MD 01/12/23 1553

## 2023-01-12 NOTE — ED Notes (Signed)
Pt walked out of room and ran out of EMS bay

## 2023-01-12 NOTE — ED Notes (Signed)
Pt refuses IV and blood work. Dr. Rodena Medin notified

## 2023-01-12 NOTE — ED Triage Notes (Signed)
BIB EMS for overdose, GCS of 4 on EMS arrival, breathing at 4x/min and has cyanotic and diaphoretic. Pt had BVM ventilation PTA and had  IM Narcan and then began to maintain his airway.

## 2023-01-17 LAB — BENZODIAZEPINES,MS,WB/SP RFX
7-Aminoclonazepam: NEGATIVE ng/mL
Alprazolam: NEGATIVE ng/mL
Benzodiazepines Confirm: NEGATIVE
Chlordiazepoxide: NEGATIVE
Clonazepam: NEGATIVE ng/mL
Desalkylflurazepam: NEGATIVE ng/mL
Desmethylchlordiazepoxide: NEGATIVE
Desmethyldiazepam: NEGATIVE ng/mL
Diazepam: NEGATIVE ng/mL
Flurazepam: NEGATIVE ng/mL
Lorazepam: NEGATIVE ng/mL
Midazolam: NEGATIVE ng/mL
Oxazepam: NEGATIVE ng/mL
Temazepam: NEGATIVE ng/mL
Triazolam: NEGATIVE ng/mL

## 2023-01-20 LAB — DRUG SCREEN 10 W/CONF, SERUM
Amphetamines, IA: POSITIVE ng/mL — AB
Barbiturates, IA: NEGATIVE ug/mL
Benzodiazepines, IA: NEGATIVE ng/mL
Cocaine & Metabolite, IA: NEGATIVE ng/mL
Methadone, IA: NEGATIVE ng/mL
Opiates, IA: NEGATIVE ng/mL
Oxycodones, IA: NEGATIVE ng/mL
Phencyclidine, IA: NEGATIVE ng/mL
Propoxyphene, IA: NEGATIVE ng/mL
THC(Marijuana) Metabolite, IA: NEGATIVE ng/mL

## 2023-01-20 LAB — AMPHETAMINES/MDMA,MS,WB/SP RFX
Amphetamine: 70 ng/mL
Amphetamines Confirmation: POSITIVE
MDA: NEGATIVE ng/mL
MDEA: NEGATIVE ng/mL
MDMA: NEGATIVE ng/mL
Methamphetamine: 373 ng/mL

## 2023-01-29 ENCOUNTER — Ambulatory Visit: Payer: Self-pay | Attending: Nurse Practitioner | Admitting: Nurse Practitioner

## 2024-04-25 ENCOUNTER — Emergency Department (HOSPITAL_COMMUNITY): Payer: Self-pay

## 2024-04-25 ENCOUNTER — Observation Stay (HOSPITAL_COMMUNITY): Payer: Self-pay

## 2024-04-25 ENCOUNTER — Observation Stay (HOSPITAL_COMMUNITY)
Admission: EM | Admit: 2024-04-25 | Discharge: 2024-04-26 | Disposition: A | Discharge status: Home / Self Care | Payer: Self-pay | Attending: Internal Medicine | Admitting: Internal Medicine

## 2024-04-25 ENCOUNTER — Encounter (HOSPITAL_COMMUNITY): Payer: Self-pay | Admitting: Internal Medicine

## 2024-04-25 ENCOUNTER — Other Ambulatory Visit: Payer: Self-pay

## 2024-04-25 DIAGNOSIS — G9340 Encephalopathy, unspecified: Secondary | ICD-10-CM

## 2024-04-25 DIAGNOSIS — Z23 Encounter for immunization: Secondary | ICD-10-CM | POA: Insufficient documentation

## 2024-04-25 DIAGNOSIS — N179 Acute kidney failure, unspecified: Principal | ICD-10-CM

## 2024-04-25 DIAGNOSIS — G40909 Epilepsy, unspecified, not intractable, without status epilepticus: Secondary | ICD-10-CM | POA: Insufficient documentation

## 2024-04-25 DIAGNOSIS — E860 Dehydration: Secondary | ICD-10-CM

## 2024-04-25 DIAGNOSIS — D72829 Elevated white blood cell count, unspecified: Secondary | ICD-10-CM | POA: Insufficient documentation

## 2024-04-25 DIAGNOSIS — E875 Hyperkalemia: Secondary | ICD-10-CM

## 2024-04-25 DIAGNOSIS — R748 Abnormal levels of other serum enzymes: Secondary | ICD-10-CM | POA: Insufficient documentation

## 2024-04-25 LAB — CBC WITH DIFFERENTIAL/PLATELET
Abs Immature Granulocytes: 0.07 K/uL (ref 0.00–0.07)
Basophils Absolute: 0.1 K/uL (ref 0.0–0.1)
Basophils Relative: 1 %
Eosinophils Absolute: 0.1 K/uL (ref 0.0–0.5)
Eosinophils Relative: 1 %
HCT: 52.2 % — ABNORMAL HIGH (ref 39.0–52.0)
Hemoglobin: 17.5 g/dL — ABNORMAL HIGH (ref 13.0–17.0)
Immature Granulocytes: 1 %
Lymphocytes Relative: 13 %
Lymphs Abs: 1.7 K/uL (ref 0.7–4.0)
MCH: 29.9 pg (ref 26.0–34.0)
MCHC: 33.5 g/dL (ref 30.0–36.0)
MCV: 89.2 fL (ref 80.0–100.0)
Monocytes Absolute: 0.8 K/uL (ref 0.1–1.0)
Monocytes Relative: 6 %
Neutro Abs: 10.4 K/uL — ABNORMAL HIGH (ref 1.7–7.7)
Neutrophils Relative %: 78 %
Platelets: 400 K/uL (ref 150–400)
RBC: 5.85 MIL/uL — ABNORMAL HIGH (ref 4.22–5.81)
RDW: 12.7 % (ref 11.5–15.5)
WBC: 13.1 K/uL — ABNORMAL HIGH (ref 4.0–10.5)
nRBC: 0 % (ref 0.0–0.2)

## 2024-04-25 LAB — URINALYSIS, ROUTINE W REFLEX MICROSCOPIC
Bilirubin Urine: NEGATIVE
Glucose, UA: NEGATIVE mg/dL
Hgb urine dipstick: NEGATIVE
Ketones, ur: 20 mg/dL — AB
Leukocytes,Ua: NEGATIVE
Nitrite: NEGATIVE
Protein, ur: NEGATIVE mg/dL
Specific Gravity, Urine: 1.019 (ref 1.005–1.030)
pH: 5 (ref 5.0–8.0)

## 2024-04-25 LAB — COMPREHENSIVE METABOLIC PANEL WITH GFR
ALT: 27 U/L (ref 0–44)
ALT: 18 U/L (ref 0–44)
AST: 47 U/L — ABNORMAL HIGH (ref 15–41)
AST: 47 U/L — ABNORMAL HIGH (ref 15–41)
Albumin: 5.7 g/dL — ABNORMAL HIGH (ref 3.5–5.0)
Albumin: 4.1 g/dL (ref 3.5–5.0)
Alkaline Phosphatase: 120 U/L (ref 38–126)
Alkaline Phosphatase: 89 U/L (ref 38–126)
Anion gap: 16 — ABNORMAL HIGH (ref 5–15)
Anion gap: 13 (ref 5–15)
BUN: 29 mg/dL — ABNORMAL HIGH (ref 6–20)
BUN: 33 mg/dL — ABNORMAL HIGH (ref 6–20)
CO2: 25 mmol/L (ref 22–32)
CO2: 24 mmol/L (ref 22–32)
Calcium: 11 mg/dL — ABNORMAL HIGH (ref 8.9–10.3)
Calcium: 9.5 mg/dL (ref 8.9–10.3)
Chloride: 94 mmol/L — ABNORMAL LOW (ref 98–111)
Chloride: 97 mmol/L — ABNORMAL LOW (ref 98–111)
Creatinine, Ser: 2.24 mg/dL — ABNORMAL HIGH (ref 0.61–1.24)
Creatinine, Ser: 2.06 mg/dL — ABNORMAL HIGH (ref 0.61–1.24)
GFR, Estimated: 37 mL/min — ABNORMAL LOW (ref >60)
GFR, Estimated: 41 mL/min — ABNORMAL LOW (ref >60)
Glucose, Bld: 73 mg/dL (ref 70–99)
Glucose, Bld: 99 mg/dL (ref 70–99)
Potassium: 6.6 mmol/L (ref 3.5–5.1)
Potassium: 6.7 mmol/L (ref 3.5–5.1)
Sodium: 135 mmol/L (ref 135–145)
Sodium: 134 mmol/L — ABNORMAL LOW (ref 135–145)
Total Bilirubin: 1 mg/dL (ref 0.0–1.2)
Total Bilirubin: 1.5 mg/dL — ABNORMAL HIGH (ref 0.0–1.2)
Total Protein: 10.3 g/dL — ABNORMAL HIGH (ref 6.5–8.1)
Total Protein: 7.6 g/dL (ref 6.5–8.1)

## 2024-04-25 LAB — RAPID URINE DRUG SCREEN, HOSP PERFORMED
Amphetamines: POSITIVE — AB
Barbiturates: NOT DETECTED
Benzodiazepines: POSITIVE — AB
Cocaine: POSITIVE — AB
Opiates: NOT DETECTED
Tetrahydrocannabinol: NOT DETECTED

## 2024-04-25 LAB — BASIC METABOLIC PANEL WITH GFR
Anion gap: 14 (ref 5–15)
BUN: 32 mg/dL — ABNORMAL HIGH (ref 6–20)
CO2: 24 mmol/L (ref 22–32)
Calcium: 9.3 mg/dL (ref 8.9–10.3)
Chloride: 96 mmol/L — ABNORMAL LOW (ref 98–111)
Creatinine, Ser: 1.72 mg/dL — ABNORMAL HIGH (ref 0.61–1.24)
GFR, Estimated: 51 mL/min — ABNORMAL LOW (ref >60)
Glucose, Bld: 112 mg/dL — ABNORMAL HIGH (ref 70–99)
Potassium: 4.4 mmol/L (ref 3.5–5.1)
Sodium: 134 mmol/L — ABNORMAL LOW (ref 135–145)

## 2024-04-25 LAB — ETHANOL: Alcohol, Ethyl (B): <15 mg/dL (ref <15)

## 2024-04-25 LAB — CK: Total CK: 1658 U/L — ABNORMAL HIGH (ref 49–397)

## 2024-04-25 LAB — POTASSIUM: Potassium: 6.9 mmol/L (ref 3.5–5.1)

## 2024-04-25 LAB — GLUCOSE, CAPILLARY: Glucose-Capillary: 138 mg/dL — ABNORMAL HIGH (ref 70–99)

## 2024-04-25 LAB — HIV ANTIBODY (ROUTINE TESTING W REFLEX): HIV Screen 4th Generation wRfx: NONREACTIVE

## 2024-04-25 MED ORDER — LACTATED RINGERS IV SOLN: INTRAVENOUS | Status: AC

## 2024-04-25 MED ORDER — ACETAMINOPHEN 325 MG PO TABS
650.0000 mg | ORAL_TABLET | Freq: Four times a day (QID) | ORAL | Status: DC | PRN
  Administered 2024-04-25: 650 mg via ORAL
  Filled 2024-04-25: qty 2

## 2024-04-25 MED ORDER — INSULIN ASPART 100 UNIT/ML IV SOLN
10.0000 [IU] | Freq: Once | INTRAVENOUS | Status: AC
  Administered 2024-04-25: 10 [IU] via INTRAVENOUS

## 2024-04-25 MED ORDER — SENNOSIDES-DOCUSATE SODIUM 8.6-50 MG PO TABS: 1.0000 | ORAL_TABLET | Freq: Every evening | ORAL | Status: DC | PRN

## 2024-04-25 MED ORDER — NALOXONE HCL 0.4 MG/ML IJ SOLN
0.4000 mg | Freq: Once | INTRAMUSCULAR | Status: AC
  Administered 2024-04-25: 0.4 mg via INTRAVENOUS
  Filled 2024-04-25: qty 1

## 2024-04-25 MED ORDER — LEVETIRACETAM 500 MG PO TABS
500.0000 mg | ORAL_TABLET | Freq: Two times a day (BID) | ORAL | Status: DC
  Administered 2024-04-25 – 2024-04-26 (×2): 500 mg via ORAL
  Filled 2024-04-25 (×2): qty 1

## 2024-04-25 MED ORDER — TETANUS-DIPHTH-ACELL PERTUSSIS 5-2.5-18.5 LF-MCG/0.5 IM SUSY
0.5000 mL | PREFILLED_SYRINGE | Freq: Once | INTRAMUSCULAR | Status: AC
  Administered 2024-04-25: 0.5 mL via INTRAMUSCULAR
  Filled 2024-04-25: qty 0.5

## 2024-04-25 MED ORDER — SODIUM ZIRCONIUM CYCLOSILICATE 10 G PO PACK
10.0000 g | PACK | Freq: Once | ORAL | Status: AC
  Administered 2024-04-25: 10 g via ORAL
  Filled 2024-04-25: qty 1

## 2024-04-25 MED ORDER — LEVETIRACETAM (KEPPRA) 500 MG/5 ML ADULT IV PUSH
1500.0000 mg | Freq: Once | INTRAVENOUS | Status: AC
  Administered 2024-04-25: 1500 mg via INTRAVENOUS
  Filled 2024-04-25: qty 15

## 2024-04-25 MED ORDER — ACETAMINOPHEN 650 MG RE SUPP: 650.0000 mg | Freq: Four times a day (QID) | RECTAL | Status: DC | PRN

## 2024-04-25 MED ORDER — ALBUTEROL SULFATE (2.5 MG/3ML) 0.083% IN NEBU
10.0000 mg | INHALATION_SOLUTION | Freq: Once | RESPIRATORY_TRACT | Status: AC
  Administered 2024-04-25: 10 mg via RESPIRATORY_TRACT
  Filled 2024-04-25: qty 12

## 2024-04-25 MED ORDER — ALBUTEROL SULFATE (2.5 MG/3ML) 0.083% IN NEBU: 10.0000 mg | INHALATION_SOLUTION | Freq: Once | RESPIRATORY_TRACT | Status: DC

## 2024-04-25 MED ORDER — LACTATED RINGERS IV BOLUS
1000.0000 mL | Freq: Once | INTRAVENOUS | Status: AC
  Administered 2024-04-25: 1000 mL via INTRAVENOUS

## 2024-04-25 MED ORDER — CALCIUM GLUCONATE-NACL 1-0.675 GM/50ML-% IV SOLN
1.0000 g | Freq: Once | INTRAVENOUS | Status: AC
  Administered 2024-04-25: 1000 mg via INTRAVENOUS
  Filled 2024-04-25: qty 50

## 2024-04-25 MED ORDER — DEXTROSE 50 % IV SOLN
1.0000 | Freq: Once | INTRAVENOUS | Status: AC
  Administered 2024-04-25: 50 mL via INTRAVENOUS
  Filled 2024-04-25: qty 50

## 2024-04-25 MED ORDER — KETOROLAC TROMETHAMINE 15 MG/ML IJ SOLN
15.0000 mg | Freq: Once | INTRAMUSCULAR | Status: AC
  Administered 2024-04-25: 15 mg via INTRAVENOUS
  Filled 2024-04-25: qty 1

## 2024-04-25 MED ORDER — ENOXAPARIN SODIUM 40 MG/0.4ML IJ SOSY: 40.0000 mg | PREFILLED_SYRINGE | INTRAMUSCULAR | Status: DC

## 2024-04-25 NOTE — Progress Notes (Signed)
 No EEG machines available.

## 2024-04-25 NOTE — Progress Notes (Signed)
EEG complete. Results pending.  ?

## 2024-04-25 NOTE — ED Notes (Signed)
 Pt found laying naked in the bed, blankets and other linens in the floor and something wet.  He says it was water, we assumed urine. PT was moved up in bed,, sheet and blankets provided, pt declined gown at this time.

## 2024-04-25 NOTE — Procedures (Signed)
 Patient Name: Jeffrey Rios  MRN: [id number]  Epilepsy Attending: Pastor Falling  Referring Physician/Provider: No ref. provider found      Date: 04/25/2024 Duration: 23 minutes   Patient history: 38 y.o. person living with a history of seizure disorder who presented with found down and admitted for acute encephalopathy and AKI. EEG to evaluate for seizure   Level of alertness: Sleep  AEDs during EEG study: LEV  Technical aspects: This EEG study was done with scalp electrodes positioned according to the 10-20 International system of electrode placement. Electrical activity was reviewed with band pass filter of 1-70Hz , sensitivity of 7 uV/mm, display speed of 87mm/sec with a 60Hz  notched filter applied as appropriate. EEG data were recorded continuously and digitally stored.  Video monitoring was available and reviewed as appropriate.  Description: The posterior dominant rhythm was not seen as patient was asleep throughout the study. Drowsiness was characterized by attenuation of the posterior background rhythm. Sleep was characterized by vertex waves, sleep spindles (12 to 14 Hz), maximal frontocentral region.  Physiologic photic driving was not seen during photic stimulation.  Hyperventilation was not performed.     ABNORMALITY -None  IMPRESSION: This asleep EEG study is within normal limits. No seizures or epileptiform discharges were seen throughout the recording. A normal interictal EEG does not exclude nor support the diagnosis of epilepsy.   Pastor Falling MD Neurology
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
## 2024-04-25 NOTE — ED Notes (Signed)
 Floor notified patient is on his way up with transport.
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
## 2024-04-25 NOTE — ED Notes (Signed)
 Pt found to have a decent lac to the pad of his middle finger on his right hand maybe a half inch long. difficult to tell how deep it was from what he would let us  see. Neither myself not the tech remember seeing it before so not sure if it happened here or we just missed it. It was bleeding a little so we put a basic bandage on it.

## 2024-04-25 NOTE — ED Notes (Signed)
 Pt refusing to remove any clothes or answer any triage questions. He I need warm up first. Pt aware that he is delaying his care by not allowing us  to perform task. Pt states, Okay, I will let you know when I am ready. Pt A&Ox4.

## 2024-04-26 ENCOUNTER — Other Ambulatory Visit (HOSPITAL_COMMUNITY): Payer: Self-pay

## 2024-04-26 DIAGNOSIS — E860 Dehydration: Secondary | ICD-10-CM

## 2024-04-26 DIAGNOSIS — E875 Hyperkalemia: Secondary | ICD-10-CM

## 2024-04-26 MED ORDER — LEVETIRACETAM 500 MG PO TABS
500.0000 mg | ORAL_TABLET | Freq: Two times a day (BID) | ORAL | 0 refills | Status: AC
  Filled 2024-04-26: qty 60, 30d supply, fill #0
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
## 2024-04-26 NOTE — Plan of Care (Signed)
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
## 2024-04-26 NOTE — TOC Transition Note (Signed)
 Transition of Care [facility]) - Discharge Note   Patient Details  Name: Jeffrey Rios MRN: [id number] Date of Birth: 03/31/1986  [facility] CM/SW Contact:  Jory Tanguma A [place], LCSW Phone Number: 04/26/2024, 11:14 AM   Clinical Narrative:     CSW met with pt at bedside, along with pt's significant other. Pt from home, possible hotel. Pt has hx of polysubstance use, UDS positive for illicit substance, CSW asked pt regarding, he denied any current use besides Marijuana occasionally.   Declined resources at this time. CSW informed him he can use those resources if needed, as they will be on pt's AVS. Additional resources on pt's AVS to address food insecurity, transportation, housing, and utilities.   Pt discharging today to hotel.   Taxi voucher provided.   No other Inpatient care management needs identified.   Final next level of care: Home/Self Care Barriers to Discharge: Barriers Resolved   Patient Goals and CMS Choice            Discharge Placement                  Name of family member notified: Pt's spouse/s.o with pt ine room Patient and family notified of of transfer: 04/26/24  Discharge Plan and Services Additional resources added to the After Visit Summary for                                       Social Drivers of Health (SDOH) Interventions SDOH Screenings   Food Insecurity: Food Insecurity Present (04/25/2024)  Housing: High Risk (04/25/2024)  Transportation Needs: Unmet Transportation Needs (04/25/2024)  Utilities: At Risk (04/25/2024)  Tobacco Use: High Risk (04/25/2024)     Readmission Risk Interventions     No data to display

## 2024-04-26 NOTE — Progress Notes (Signed)
 Pt is confused. Only able to answer who and where he is. Woman at bedside states she is his wife and refused to allow labs to be drawn stating they already took 11 tubes. That's enough. Explained to her the labs are needed to give insight on what is going on with pt. She then stated what's going on with him is external, not internal. There was no reasoning with her. Labs were not drawn.

## 2024-04-26 NOTE — Progress Notes (Signed)
 04/26/2024  Jeffrey Rios DOB: 17-Aug-1986 MRN: [id number]   RIDER WAIVER AND RELEASE OF LIABILITY  For the purposes of helping with transportation needs, [facility] partners with outside transportation providers (taxi companies, [place], Catering manager.) to give [facility] patients or other approved people the choice of on-demand rides Public librarian) to our buildings for non-emergency visits.  By using [employer], I, the person signing this document, on behalf of myself and/or any legal minors (in my care using the [employer]), agree:  Science writer given to me are supplied by independent, outside transportation providers who do not work for, or have any affiliation with, [employer]. [facility] is not a transportation company. [facility] has no control over the quality or safety of the rides I get using [employer]. [facility] has no control over whether any outside ride will happen on time or not. [facility] gives no guarantee on the reliability, quality, safety, or availability on any rides, or that no mistakes will happen. I know and accept that traveling by vehicle (car, truck, SVU, fleeta, bus, taxi, etc.) has risks of serious injuries such as disability, being paralyzed, and death. I know and agree the risk of using [employer] is mine alone, and not [employer]. [employer] are provided as is and as are available. The transportation providers are in charge for all inspections and care of the vehicles used to provide these rides. I agree not to take legal action against [facility], its agents, employees, officers, directors, representatives, insurers, attorneys, assigns, successors, subsidiaries, and affiliates at any time for any reasons related directly or indirectly to using [employer]. I also agree not to take legal action against [facility] [place] or its affiliates for any injury, death, or damage to property caused by or related to using  [employer]. I have read this Waiver and Release of Liability, and I understand the terms used in it and their legal meaning. This Waiver is freely and voluntarily given with the understanding that my right (or any legal minors) to legal action against [facility] relating to [employer] is knowingly given up to use these services.   I attest that I read the Ride Waiver and Release of Liability to Jeffrey Rios, gave Jeffrey Rios the opportunity to ask questions and answered the questions asked (if any). I affirm that Jeffrey Rios then provided consent for assistance with transportation.
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
## 2024-04-28 ENCOUNTER — Other Ambulatory Visit (HOSPITAL_COMMUNITY): Payer: Self-pay

## 2024-05-02 ENCOUNTER — Other Ambulatory Visit (HOSPITAL_COMMUNITY): Payer: Self-pay
